# Patient Record
Sex: Male | Born: 1975 | Race: White | Hispanic: No | Marital: Married | Smoking: Current every day smoker
Health system: Southern US, Community
[De-identification: ages and names within clinical notes are randomized; demographics above are authoritative.]

## PROBLEM LIST (undated history)

## (undated) DIAGNOSIS — J45909 Unspecified asthma, uncomplicated: Secondary | ICD-10-CM

---

## 1997-12-12 ENCOUNTER — Emergency Department (HOSPITAL_COMMUNITY): Admission: EM | Admit: 1997-12-12 | Discharge: 1997-12-12 | Payer: Self-pay | Admitting: Emergency Medicine

## 1998-05-18 ENCOUNTER — Emergency Department (HOSPITAL_COMMUNITY): Admission: EM | Admit: 1998-05-18 | Discharge: 1998-05-18 | Payer: Self-pay | Admitting: Emergency Medicine

## 1998-05-18 ENCOUNTER — Encounter: Payer: Self-pay | Admitting: Emergency Medicine

## 1999-11-12 ENCOUNTER — Emergency Department (HOSPITAL_COMMUNITY): Admission: EM | Admit: 1999-11-12 | Discharge: 1999-11-12 | Payer: Self-pay

## 2000-04-28 ENCOUNTER — Encounter: Payer: Self-pay | Admitting: Emergency Medicine

## 2000-04-28 ENCOUNTER — Emergency Department (HOSPITAL_COMMUNITY): Admission: EM | Admit: 2000-04-28 | Discharge: 2000-04-28 | Payer: Self-pay | Admitting: Emergency Medicine

## 2002-09-25 ENCOUNTER — Encounter: Admission: RE | Admit: 2002-09-25 | Discharge: 2002-09-25 | Payer: Self-pay | Admitting: Family Medicine

## 2003-04-03 ENCOUNTER — Emergency Department (HOSPITAL_COMMUNITY): Admission: EM | Admit: 2003-04-03 | Discharge: 2003-04-03 | Payer: Self-pay | Admitting: *Deleted

## 2006-09-19 ENCOUNTER — Emergency Department (HOSPITAL_COMMUNITY): Admission: EM | Admit: 2006-09-19 | Discharge: 2006-09-19 | Payer: Self-pay | Admitting: Emergency Medicine

## 2006-11-17 ENCOUNTER — Emergency Department (HOSPITAL_COMMUNITY): Admission: EM | Admit: 2006-11-17 | Discharge: 2006-11-17 | Payer: Self-pay | Admitting: Emergency Medicine

## 2007-04-08 ENCOUNTER — Inpatient Hospital Stay (HOSPITAL_COMMUNITY): Admission: EM | Admit: 2007-04-08 | Discharge: 2007-04-12 | Payer: Self-pay | Admitting: Emergency Medicine

## 2007-04-12 ENCOUNTER — Ambulatory Visit: Payer: Self-pay | Admitting: *Deleted

## 2007-04-12 ENCOUNTER — Inpatient Hospital Stay (HOSPITAL_COMMUNITY): Admission: RE | Admit: 2007-04-12 | Discharge: 2007-04-15 | Payer: Self-pay | Admitting: *Deleted

## 2010-11-11 NOTE — Group Therapy Note (Signed)
[name], [name]              ACCOUNT NO.:  [id number]   MEDICAL RECORD NO.:  [id number]          PATIENT TYPE:  INP   LOCATION:  A225                          FACILITY:  [facility]   PHYSICIAN:  Osvaldo Shipper, MD     DATE OF BIRTH:  Dec 08, 1975   DATE OF PROCEDURE:  04/12/2007  DATE OF DISCHARGE:  04/12/2007                                 PROGRESS NOTE   SUBJECTIVE:  Continues to feel well.  No complaints offered.   Vital signs are all pretty stable.  Examination is unremarkable.   ASSESSMENT/PLAN:  The patient is still waiting for a bed at a  psychiatric facility to be evaluated by a psychiatrist for suicidal  attempt.  The circumstances of the attempts were after an argument with  his wife.  The patient appears to be more calm today.  However, I am not  sure if we do let him go home if the situation may recur without any  kind of psychiatric assessment and treatment.  So I think the patient  still needs to be seen by a psychiatrist before he can be discharged  home.  He has been medically cleared for the past 4 days.  Unfortunately, there is no bed available at [facility]. Hence, he has  been waiting here. However, I was told that there is the possibility  that a bed might open up tomorrow and so the patient will be able to go  there tomorrow.      Osvaldo Shipper, MD  Electronically Signed     GK/MEDQ  D:  04/12/2007  T:  04/12/2007  Job:  [id number]

## 2010-11-11 NOTE — H&P (Signed)
[name], HUGE              ACCOUNT NO.:  [id number]   MEDICAL RECORD NO.:  [id number]          PATIENT TYPE:  INP   LOCATION:  IC06                          FACILITY:  [facility]   PHYSICIAN:  Marcello Moores, MD   DATE OF BIRTH:  05-Oct-1975   DATE OF ADMISSION:  04/07/2007  DATE OF DISCHARGE:  LH                              HISTORY & PHYSICAL   PRIMARY MEDICAL DOCTOR:  Unassigned.   CHIEF COMPLAINT:  He was brought by EMS after he took clorazepate and  Tylenol for suicidal purpose.   HISTORY OF PRESENT ILLNESS:  [name] is a 35 year old man, married,  who was brought by EMS to the emergency room today after he took the  above medications for drug overdose.  The history was obtained from EMS  and family by the emergency room physician and the patient and taken  Tylenol and benzodiazepine from his father's medication for a suicidal  attempt.  Currently, the patient is very drowsy and he cannot give any  reasonable history, but he they stated that he took the medication  because he had some argument with his wife and he felt that she no  longer loves him, but later on, the patient also denied that he took it  to kill himself, only stated that he took it because he had a very bad  day.  Currently, the patient is sleepy; otherwise, he is with stable  vital signs and the patient received charcoal in the emergency room and  Poison Control was contacted by the emergency room physician and  recommended to monitor him only.  I tried to take some history from him,  but the patient is very drowsy and sleepy.  He cannot give currently any  history.   REVIEW OF SYSTEMS:  As mentioned in the HPI; it is limited.   ALLERGIES:  NO KNOWN DRUG ALLERGIES.   SOCIAL HISTORY:  He is married and has children, did not smoke tobacco,  drug or alcohol abuse.   PAST MEDICAL HISTORY:  None.   MEDICATIONS:  None.   PHYSICAL EXAMINATION:  GENERAL:  The patient is lying in the emergency  room bed  attached to the monitor, drowsy.  VITAL SIGNS: Temperature 97, blood pressure is 125/73, pulse is 93 and  respiratory rate 20, and saturation is 98%.  HEENT:  He has pink conjunctivae and pupils are equal and reactive, but  slowly.  NECK:  Supple.  CHEST:  Good air entry.  CV: S1-S2 well-heard, regular.  ABDOMEN:  Soft.  EXTREMITIES:  No edema.  CNS:  He is drowsy and sleepy, but there are not any focal neurological  deficits.   LABORATORY DATA:  White blood cell count is 9.6, hemoglobin is 15 and  hematocrit is 43 and platelet count is 179,000.  On the chemistries,  sodium is 139, potassium 3.7, chloride is 107 and bicarb 27, glucose 91,  BUN 15, creatinine is 1.  Alcohol level is less than 5 and acetaminophen  level is 32, on the upper normal.  Urine drug screen is pending.   ASSESSMENT:  1. Drug overdose  with acetaminophen, possibility also Benadryl and      benzodiazepines and the patient is drowsy; otherwise, he is stable      currently and will admit him to intensive care unit.  We will put      him on a monitor to monitor his vital signs, especially his      respiratory rate and blood pressure.  We will repeat the      acetaminophen level every 4 hours and will send also liver function      tests and we will repeat it also after 4 hours and we will follow      the urine screen and we will manage him according to his clinical      response.  2. Probably suicidal attempt with this drug overdose.  We will call      ACT team to evaluate him for his psychiatric issues and we will      rehydrate him with intravenous fluid and will put him on Protonix      and Lovenox prophylaxis.      Marcello Moores, MD  Electronically Signed     MT/MEDQ  D:  04/08/2007  T:  04/08/2007  Job:  [id number]

## 2010-11-11 NOTE — Discharge Summary (Signed)
[name], [name]              ACCOUNT NO.:  [id number]   MEDICAL RECORD NO.:  [id number]          PATIENT TYPE:  INP   LOCATION:  A225                          FACILITY:  [facility]   PHYSICIAN:  Osvaldo Shipper, MD     DATE OF BIRTH:  28-Oct-1975   DATE OF ADMISSION:  04/07/2007  DATE OF DISCHARGE:  10/11/2008LH                               DISCHARGE SUMMARY   The patient is being transferred to [facility] for psychiatric are.   ADMISSION DIAGNOSES:  1. Suicidal attempt with Tranxene and Tylenol.  2. Likely depression.   Please refer H&P dictated by Dr. Benson Setting for details regarding patient's  presenting illness.   BRIEF HOSPITAL COURSE:  Briefly, this is a 35 year old Caucasian male  who was brought in by EMS after he overdosed on Tranxene and Tylenol. He  apparently took these medications after an argument with his wife. There  is no previous history of suicidal attempt as far as we know. The  patient was drowsy when he was brought in. He was monitored in the ICU  when he woke up. He was subsequently transferred out to the floor. His  Tylenol level initially was 31, and at 4 hours, it was normal. Tylenol  levels were followed over the next day, and they continued to be less  than 10. He had a mild elevation in his bilirubin which prompted concern  from the psychiatric facility, and today, the LFTs are completely  normal. His EKG showed some early repolarization changes in I and aVL  which have remained stable. He never complained of any chest pain.   Over the past two to three days, the patient has remained stable. His  vital signs were stable. Considering the above, he continues to be  stable and is okay for transfer to a psychiatric facility. He will need  to be evaluated by a psychiatrist who can then determine his  disposition.   He needs to be on a nicotine patch 21 mg every day as he smokes a lot of  cigarettes. He smokes about 2 packs of cigarettes on a daily  basis.   Further disposition according to the psychiatrist. This morning, he was  seen, no complains were offered. His vital signs were stable.  Examination was unremarkable. Labs show that his bilirubin is now  completely normal.      Osvaldo Shipper, MD  Electronically Signed     GK/MEDQ  D:  04/09/2007  T:  04/09/2007  Job:  [id number]

## 2010-11-11 NOTE — Group Therapy Note (Signed)
NAMEPETROS, [name]              ACCOUNT NO.:  [id number]   MEDICAL RECORD NO.:  [id number]          PATIENT TYPE:  INP   LOCATION:  A225                          FACILITY:  [facility]   PHYSICIAN:  Osvaldo Shipper, MD     DATE OF BIRTH:  1975-07-08   DATE OF PROCEDURE:  04/11/2007  DATE OF DISCHARGE:                                 PROGRESS NOTE   Really no other issues on this patient.  Everything is stable.  His  wheezing has resolved.  His vital signs are all stable.  He has no  complaints to offer.  Unfortunately we are still waiting on a  disposition on him.  He needs to be seen by a psychiatrist before he can  be cleared for discharge.  Medically he has been stable for the past 3  days.  Hopefully we will have some resolution to this issue in the next  day or two.      Osvaldo Shipper, MD  Electronically Signed     GK/MEDQ  D:  04/11/2007  T:  04/11/2007  Job:  [id number]

## 2010-11-11 NOTE — Discharge Summary (Signed)
NAMEJOSEDANIEL, HAYE NO.:  [id number]   MEDICAL RECORD NO.:  [id number]          PATIENT TYPE:  IPS   LOCATION:  0605                          [facility]   PHYSICIAN:  Jasmine Pang, M.D. DATE OF BIRTH:  07-Jun-1976   DATE OF ADMISSION:  04/12/2007  DATE OF DISCHARGE:  04/15/2007                               DISCHARGE SUMMARY   IDENTIFICATION:  This is a 35 year old married white male who was  admitted on an involuntary basis on April 12, 2007.   HISTORY OF PRESENT ILLNESS:  The patient presents with a history of  intentional overdose of Tylenol and Tranxene.  He is here on papers.  Papers state that the patient overdosed on 10 Tylenol PM and 25 Tranxene  tablets.  The patient states that he had spent 4 days in the emergency  room at [facility] until a bed was found for him.  He states that he had  been taking his father's Tranxene.  He reports his intention was to go  to sleep.  He states it was not a suicidal gesture.  He called a friend  because he was not feeling well.  The friend called 911 and the  ambulance brought him to the emergency department.  The patient states  he has been depressed for at least 5 years.  He has never been on any  antidepressant.  He is having money issues.  His wife, although  supportive, is concerned about his being unemployed again.  They are  having economic problems at home.  This is the first admission to  [facility].  There is no other suicidal gestures or  attempts.  No other psychiatric hospitalizations.  He does not see a  therapist or psychiatrist as an outpatient.  He reports his father is on  medication for anxiety.  No other known family history.  He denies any  alcohol or drug use.  There are no known medical problems.  No  medications.  He is allergic to SULFA (history of hives).   PHYSICAL EXAMINATION:  The patient was fully assessed at [facility].  There were no acute physical or  medical findings.   LABORATORY DATA:  The patient's CBC was within normal limits.  Liver  enzymes were within normal limits.  Acetaminophen level was initially  high at 31.8, down to less than 10 prior to transfer.  Urine drug screen  was positive for benzodiazepines.   HOSPITAL COURSE:  Upon admission, the patient was started on Ambien 10  mg p.o. q.h.s. p.r.n. insomnia and Librium 10 mg p.o. q.6h. p.r.n.  withdrawal/anxiety.  On April 13, 2007, he was also started on Celexa  20 mg p.o. q.h.s. and Abilify 10 mg p.o. q.h.s.  The patient tolerated  these medications well.  He was friendly and cooperative.  He was able  to participate appropriately in the therapeutic groups and activities.  He discussed his overdose.  He reports he had been in an argument with  his wife.  He stated, however, in general, he gets along with his wife.  As  hospitalization progressed, he became less depressed and anxious.  No  suicidal or homicidal ideation.  He began to look forward to going home.  He felt he could manage stressors better.  On April 15, 2007, mental  status had improved markedly from admission status.  The patient was  friendly and cooperative with good eye contact.  Speech was normal rate  and flow.  Psychomotor activity was within normal limits.  Mood  euthymic.  Affect wide range.  No suicidal or homicidal ideation.  No  thoughts of self-injurious behavior.  No auditory or visual  hallucinations.  No paranoia or delusions.  Thoughts were logical and  goal directed.  Thought content, no predominant theme.  Cognitive was  grossly back to baseline and it was felt the patient was safe to return  home today.  His followup was scheduled in [place] at the mental  health center and [facility].  We had attempted to schedule a  family session with him and his wife, but there was no availability  before he left.   DISCHARGE DIAGNOSES:  AXIS I:  Mood disorder, not otherwise specified.   AXIS II:  None.  AXIS III:  None.  AXIS IV:  Severe (occupational problems, economic issues, burden of  psychiatric illness, problems with some conflict with wife).  AXIS V:  Global assessment of functioning was 50 upon discharge.  Global  assessment of functioning was 35 upon admission.  Global assessment of  functioning was 65 highest past year.   DISCHARGE/PLAN:  There were no specific activity level or dietary  restrictions.   POST-HOSPITAL CARE PLANS:  The patient will be seen at the [facility] on April 27, 2007, at 3:30 p.m.  He will  be seen in [facility] in [place] on April 20, 2007, at 10 o'clock a.m.   DISCHARGE MEDICATIONS:  1. Celexa 20 mg at bedtime.  2. Abilify 10 mg at bedtime.  3. Ambien 10 mg at bedtime p.r.n. insomnia.      Jasmine Pang, M.D.  Electronically Signed     BHS/MEDQ  D:  04/15/2007  T:  04/15/2007  Job:  [id number]

## 2010-11-11 NOTE — Discharge Summary (Signed)
NAMEBERKLEY, CRONKRIGHT              ACCOUNT NO.:  [id number]   MEDICAL RECORD NO.:  [id number]          PATIENT TYPE:  INP   LOCATION:  A225                          FACILITY:  [facility]   PHYSICIAN:  Osvaldo Shipper, MD     DATE OF BIRTH:  [birth year]   DATE OF ADMISSION:  04/12/2007  DATE OF DISCHARGE:  10/14/2008LH                               DISCHARGE SUMMARY   DISCHARGE SUMMARY   Please review H&P dictated by Dr. Marcello Moores and my progress notes  and discharge summary dictated previously.   BRIEF HOSPITAL COURSE:  Briefly, this is a 35 year old man who presented  after he was brought in by the EMS for being less responsive.  He  apparently took some Tylenol and benzodiazepine in the from of Tranxene  at home after an argument with his wife.  The patient mentioned that he  has been depressed.  He also gets upset after arguments with his wife.  The patient was monitored in the ICU overnight.  He did well.  He was  transferred to the floor and was medically cleared for discharge.  For  the past 4 days, he has been waiting to be placed into a behavioral  health facility.  I was just informed a few minutes ago that the bed has  opened up, and he will be transferred later today to behavioral health.  As mentioned above, he has continued to remain stable for the past 4  days.  He did have a slightly elevated bilirubin, which also normalized.  He also had some early depolarization changes on his EKG.   He is not being sent on any medications at this time.  He might require  to be on some psychiatric medications as per the psychiatrist at  behavioral health.   Followup and further disposition to be addressed on discharge from  behavioral health.      Osvaldo Shipper, MD  Electronically Signed     GK/MEDQ  D:  04/12/2007  T:  04/13/2007  Job:  [id number]

## 2010-11-11 NOTE — Group Therapy Note (Signed)
[name], [name]              ACCOUNT NO.:  [id number]   MEDICAL RECORD NO.:  [id number]          PATIENT TYPE:  INP   LOCATION:  A225                          FACILITY:  [facility]   PHYSICIAN:  Osvaldo Shipper, MD     DATE OF BIRTH:  September 02, 1975   DATE OF PROCEDURE:  04/10/2007  DATE OF DISCHARGE:                                 PROGRESS NOTE   Please see the discharge summary dictated yesterday for details  regarding the patient's hospital stay.   Subjectively, he continues to do well. There are no symptoms. His vital  signs are all stable. Examination is unremarkable.   Again, he is waiting on a bed at [facility] for psychiatric  evaluation. Unfortunately, it is taking longer than we expected. We will  try to contact them again today to see what is the likelihood of him  getting a bed today. The patient understands that he has to wait and  there are no new issues at this. His blood work from yesterday showed  that his LFTs are completely normal, but his EKG continues to show some  early repolarization changes. He does not have any chest pain or any  other symptoms at this time.   So in summary, the patient continues to be medically stable and is  awaiting a bed for psychiatric evaluation.      Osvaldo Shipper, MD  Electronically Signed     GK/MEDQ  D:  04/10/2007  T:  04/10/2007  Job:  [id number]

## 2010-11-11 NOTE — H&P (Signed)
NAMEELON, EOFF NO.:  [id number]   MEDICAL RECORD NO.:  [id number]          PATIENT TYPE:  IPS   LOCATION:  0605                          [facility]ACILITY:  [facility]   PHYSICIAN:  Jasmine Pang, M.D. DATE OF BIRTH:  June 23, 1976   DATE OF ADMISSION:  04/12/2007  DATE OF DISCHARGE:                       PSYCHIATRIC ADMISSION ASSESSMENT   IDENTIFYING INFORMATION:  This is a 35 year old married white male who  is involuntary committed April 12, 2007.   HISTORY OF PRESENT ILLNESS:  The patient presents with a history of an  intentional overdose of Tylenol and Tranxene.  He is here on papers.  Papers state that the patient overdosed on 10 Tylenol PM and 25 Tranxene  tablets.  The patient states that he had spent four days in emergency  room at [facility] until a bed was found for him.  He states he has been  taking his father's Tranxene.  He states his intention was to go to  sleep.  States it was not a suicidal gesture.  He called his friend  because he was not feeling well.  Friend had called 9-1-1 and an  ambulance was brought to his home.  The patient states he has been  depressed for least five years.  Has never been on any antidepressant.  He is having money issues.  Wife although supportive is concerned about  his being unemployed and again, economic problems at home.   PAST PSYCHIATRIC HISTORY:  First admission to the [facility].  No other suicide gestures or attempts.  No other psychiatric  hospitalizations, does not see a therapist or a psychiatrist as an  outpatient.   SOCIAL HISTORY:  This is a 35 year old married white male, married for  10 years, has three children.  He is unemployed.  The patient was to  start work this past Monday.   FAMILY HISTORY:  Father is on medication for anxiety.   ALCOHOL/DRUG HISTORY:  She denies any alcohol or drug use.   PRIMARY CARE PHYSICIAN:  Has none.   MEDICAL PROBLEMS:  No known medical problems.   MEDICATIONS:  None.   ALLERGIES:  SULFA.  Reports a history of hives.   PHYSICAL EXAMINATION:  The patient was fully assessed at [facility].  He is a tall well-nourished male.  He was charcoaled after  his overdose.  His temperature is 97.3, heart rate 77, respirations 12,  blood pressure 151/98, height 6 feet 4 inches tall, weight 239 pounds.   LABORATORY DATA:  His CBC is within normal limits.  Liver enzymes were  within normal limits.  Acetaminophen level was initially high at 31.8,  down to less than 10 prior to transfer.  Urine drug screen was positive  for benzodiazepines.   MENTAL STATUS EXAM:  He is fully alert, cooperative, casually dressed,  good eye contact.  Speech is rapid, sometimes difficult to understand.  Mood is anxious and depressed.  The patient appears sad.  Thought  processes are coherent.  No evidence of any thought disorder.  Cognitive  function intact.  Memory is good.  Judgment and insight  are fair.  He  appears sincere.   DIAGNOSES:  AXIS I:  Major depressive disorder.  AXIS II:  Deferred.  AXIS III:  No known medical problems.  AXIS IV:  Problems with occupation, economic issues.  AXIS V:  Current 35.   PLAN:  Contract for safety.  Stabilize mood and thinking.  Will consider  an antidepressant with risks and benefits being discussed.  The patient  is agreeable to beginning medications.  Will have a family session with  his wife.  [facility] is to obtain follow-up.   TENTATIVE LENGTH OF STAY:  Three to four days.      Landry Corporal, N.P.      Jasmine Pang, M.D.  Electronically Signed    JO/MEDQ  D:  04/13/2007  T:  04/13/2007  Job:  [id number]

## 2010-12-07 ENCOUNTER — Emergency Department (HOSPITAL_COMMUNITY): Payer: Self-pay

## 2010-12-07 ENCOUNTER — Emergency Department (HOSPITAL_COMMUNITY)
Admission: EM | Admit: 2010-12-07 | Discharge: 2010-12-08 | Disposition: A | Discharge status: Home / Self Care | Payer: Self-pay | Attending: Emergency Medicine | Admitting: Emergency Medicine

## 2010-12-07 DIAGNOSIS — Y92410 Unspecified street and highway as the place of occurrence of the external cause: Secondary | ICD-10-CM | POA: Insufficient documentation

## 2010-12-07 DIAGNOSIS — R05 Cough: Secondary | ICD-10-CM | POA: Insufficient documentation

## 2010-12-07 DIAGNOSIS — S20219A Contusion of unspecified front wall of thorax, initial encounter: Secondary | ICD-10-CM | POA: Insufficient documentation

## 2010-12-07 DIAGNOSIS — R059 Cough, unspecified: Secondary | ICD-10-CM | POA: Insufficient documentation

## 2010-12-07 DIAGNOSIS — R079 Chest pain, unspecified: Secondary | ICD-10-CM | POA: Insufficient documentation

## 2010-12-09 ENCOUNTER — Emergency Department (HOSPITAL_COMMUNITY)
Admission: EM | Admit: 2010-12-09 | Discharge: 2010-12-09 | Disposition: A | Discharge status: Home / Self Care | Payer: Self-pay | Attending: Emergency Medicine | Admitting: Emergency Medicine

## 2010-12-09 DIAGNOSIS — F1020 Alcohol dependence, uncomplicated: Secondary | ICD-10-CM | POA: Insufficient documentation

## 2010-12-09 DIAGNOSIS — F1010 Alcohol abuse, uncomplicated: Secondary | ICD-10-CM | POA: Insufficient documentation

## 2010-12-09 DIAGNOSIS — F1410 Cocaine abuse, uncomplicated: Secondary | ICD-10-CM | POA: Insufficient documentation

## 2010-12-09 DIAGNOSIS — F411 Generalized anxiety disorder: Secondary | ICD-10-CM | POA: Insufficient documentation

## 2010-12-09 LAB — CBC
MCH: 32.5 pg (ref 26.0–34.0)
MCV: 91.6 fL (ref 78.0–100.0)
Platelets: 195 10*3/uL (ref 150–400)
RBC: 5.02 MIL/uL (ref 4.22–5.81)
RDW: 12.7 % (ref 11.5–15.5)

## 2010-12-09 LAB — COMPREHENSIVE METABOLIC PANEL
ALT: 36 U/L (ref 0–53)
Alkaline Phosphatase: 94 U/L (ref 39–117)
BUN: 14 mg/dL (ref 6–23)
CO2: 26 mEq/L (ref 19–32)
Chloride: 101 mEq/L (ref 96–112)
GFR calc Af Amer: >60 mL/min (ref >60)
GFR calc non Af Amer: >60 mL/min (ref >60)
Glucose, Bld: 137 mg/dL — ABNORMAL HIGH (ref 70–99)
Potassium: 3.8 mEq/L (ref 3.5–5.1)
Sodium: 138 mEq/L (ref 135–145)
Total Bilirubin: 1.4 mg/dL — ABNORMAL HIGH (ref 0.3–1.2)
Total Protein: 7.7 g/dL (ref 6.0–8.3)

## 2010-12-09 LAB — DIFFERENTIAL
Basophils Relative: 0 % (ref 0–1)
Eosinophils Absolute: 0.2 10*3/uL (ref 0.0–0.7)
Eosinophils Relative: 2 % (ref 0–5)
Monocytes Relative: 7 % (ref 3–12)
Neutrophils Relative %: 61 % (ref 43–77)

## 2010-12-09 LAB — RAPID URINE DRUG SCREEN, HOSP PERFORMED
Amphetamines: NOT DETECTED
Opiates: NOT DETECTED

## 2011-04-09 LAB — HEPATIC FUNCTION PANEL
ALT: 41
AST: 23
AST: 23
Albumin: 3.7
Albumin: 3.7
Albumin: 4
Albumin: 4.1
Alkaline Phosphatase: 85
Indirect Bilirubin: 1.1 — ABNORMAL HIGH
Indirect Bilirubin: 1.1 — ABNORMAL HIGH
Total Bilirubin: 1
Total Bilirubin: 1.3 — ABNORMAL HIGH
Total Protein: 6
Total Protein: 6.2
Total Protein: 6.8

## 2011-04-09 LAB — ACETAMINOPHEN LEVEL
Acetaminophen (Tylenol), Serum: <10 — ABNORMAL LOW
Acetaminophen (Tylenol), Serum: <10 — ABNORMAL LOW

## 2011-04-09 LAB — BASIC METABOLIC PANEL
Calcium: 8.5
Chloride: 107
Chloride: 108
Creatinine, Ser: 0.92
GFR calc Af Amer: >60
GFR calc Af Amer: >60
GFR calc non Af Amer: >60
Potassium: 3.7

## 2011-04-09 LAB — DIFFERENTIAL
Eosinophils Absolute: 0.3
Eosinophils Relative: 3
Lymphs Abs: 4.1 — ABNORMAL HIGH
Monocytes Relative: 7
Neutrophils Relative %: 47

## 2011-04-09 LAB — RAPID URINE DRUG SCREEN, HOSP PERFORMED
Amphetamines: NOT DETECTED
Benzodiazepines: POSITIVE — AB
Opiates: NOT DETECTED
Tetrahydrocannabinol: NOT DETECTED

## 2011-04-09 LAB — CBC
HCT: 42.9
MCV: 93.8
RBC: 4.58
WBC: 9.6

## 2011-04-09 LAB — ETHANOL: Alcohol, Ethyl (B): <5

## 2011-04-18 ENCOUNTER — Emergency Department (HOSPITAL_COMMUNITY)
Admission: EM | Admit: 2011-04-18 | Discharge: 2011-04-18 | Disposition: A | Discharge status: Home / Self Care | Payer: Self-pay | Attending: Emergency Medicine | Admitting: Emergency Medicine

## 2011-04-18 DIAGNOSIS — J029 Acute pharyngitis, unspecified: Secondary | ICD-10-CM | POA: Insufficient documentation

## 2011-04-18 DIAGNOSIS — R5381 Other malaise: Secondary | ICD-10-CM | POA: Insufficient documentation

## 2011-04-18 DIAGNOSIS — J3489 Other specified disorders of nose and nasal sinuses: Secondary | ICD-10-CM | POA: Insufficient documentation

## 2011-04-18 DIAGNOSIS — R220 Localized swelling, mass and lump, head: Secondary | ICD-10-CM | POA: Insufficient documentation

## 2011-04-18 DIAGNOSIS — R509 Fever, unspecified: Secondary | ICD-10-CM | POA: Insufficient documentation

## 2011-04-18 DIAGNOSIS — R5383 Other fatigue: Secondary | ICD-10-CM | POA: Insufficient documentation

## 2011-04-18 DIAGNOSIS — IMO0001 Reserved for inherently not codable concepts without codable children: Secondary | ICD-10-CM | POA: Insufficient documentation

## 2011-04-18 DIAGNOSIS — R221 Localized swelling, mass and lump, neck: Secondary | ICD-10-CM | POA: Insufficient documentation

## 2011-07-01 ENCOUNTER — Encounter: Payer: Self-pay | Admitting: *Deleted

## 2011-07-01 ENCOUNTER — Emergency Department (HOSPITAL_COMMUNITY)
Admission: EM | Admit: 2011-07-01 | Discharge: 2011-07-01 | Disposition: A | Discharge status: Home / Self Care | Payer: Self-pay | Attending: Emergency Medicine | Admitting: Emergency Medicine

## 2011-07-01 DIAGNOSIS — M542 Cervicalgia: Secondary | ICD-10-CM | POA: Insufficient documentation

## 2011-07-01 DIAGNOSIS — M25519 Pain in unspecified shoulder: Secondary | ICD-10-CM | POA: Insufficient documentation

## 2011-07-01 DIAGNOSIS — H9209 Otalgia, unspecified ear: Secondary | ICD-10-CM | POA: Insufficient documentation

## 2011-07-01 DIAGNOSIS — M62838 Other muscle spasm: Secondary | ICD-10-CM | POA: Insufficient documentation

## 2011-07-01 DIAGNOSIS — IMO0001 Reserved for inherently not codable concepts without codable children: Secondary | ICD-10-CM | POA: Insufficient documentation

## 2011-07-01 DIAGNOSIS — H9210 Otorrhea, unspecified ear: Secondary | ICD-10-CM | POA: Insufficient documentation

## 2011-07-01 MED ORDER — IBUPROFEN 800 MG PO TABS: 800.0000 mg | ORAL_TABLET | Freq: Three times a day (TID) | ORAL | Status: AC

## 2011-07-01 MED ORDER — OXYCODONE-ACETAMINOPHEN 5-325 MG PO TABS
1.0000 | ORAL_TABLET | Freq: Once | ORAL | Status: AC
  Administered 2011-07-01: 1 via ORAL
  Filled 2011-07-01: qty 1

## 2011-07-01 MED ORDER — CYCLOBENZAPRINE HCL 10 MG PO TABS: 10.0000 mg | ORAL_TABLET | Freq: Three times a day (TID) | ORAL | Status: AC | PRN

## 2011-07-01 MED ORDER — CYCLOBENZAPRINE HCL 10 MG PO TABS
10.0000 mg | ORAL_TABLET | Freq: Once | ORAL | Status: AC
  Administered 2011-07-01: 10 mg via ORAL
  Filled 2011-07-01: qty 1

## 2011-07-01 MED ORDER — IBUPROFEN 800 MG PO TABS
800.0000 mg | ORAL_TABLET | Freq: Once | ORAL | Status: AC
  Administered 2011-07-01: 800 mg via ORAL
  Filled 2011-07-01: qty 1

## 2011-07-01 NOTE — ED Notes (Signed)
Sharp pain from rt ear down thru neck since last sat denies injury states his dada did give him some valuim but he still hurtas

## 2011-07-01 NOTE — ED Notes (Signed)
Patient woke with a sharp pain from the back on his right ear that moves down his neck and into his shoulder.  Patient denies any trauma

## 2011-07-02 NOTE — ED Provider Notes (Signed)
History     CSN: [id number]  Arrival date & time 07/01/11  1003   First MD Initiated Contact with Patient 07/01/11 1115      Chief Complaint  Patient presents with  . Otalgia  . Neck Pain  . Shoulder Pain    (Consider location/radiation/quality/duration/timing/severity/associated sxs/prior treatment) Patient is a 36 y.o. male presenting with ear pain, neck pain, and shoulder pain. The history is provided by the patient. No language interpreter was used.  Otalgia This is a new problem. The current episode started more than 2 days ago. There is pain in the right ear. The problem occurs constantly. The problem has been gradually worsening. There has been no fever. The pain is at a severity of 6/10. The pain is moderate. Associated symptoms include ear discharge and neck pain. Pertinent negatives include no headaches, no hearing loss and no diarrhea. His past medical history does not include chronic ear infection or hearing loss.  Neck Pain  Pertinent negatives include no headaches.  Shoulder Pain Associated symptoms include neck pain. Pertinent negatives include no headaches.    History reviewed. No pertinent past medical history.  History reviewed. No pertinent past surgical history.  No family history on file.  History  Substance Use Topics  . Smoking status: Current Everyday Smoker  . Smokeless tobacco: Not on file  . Alcohol Use: No      Review of Systems  HENT: Positive for ear pain, neck pain and ear discharge. Negative for hearing loss.   Gastrointestinal: Negative for diarrhea.  Neurological: Negative for headaches.  All other systems reviewed and are negative.    Allergies  Sulfa antibiotics  Home Medications   Current Outpatient Rx  Name Route Sig Dispense Refill  . CYCLOBENZAPRINE HCL 10 MG PO TABS Oral Take 1 tablet (10 mg total) by mouth 3 (three) times daily as needed for muscle spasms. 30 tablet 0  . IBUPROFEN 800 MG PO TABS Oral Take 1 tablet (800  mg total) by mouth 3 (three) times daily. 21 tablet 0    BP 134/75  Pulse 75  Temp(Src) 97.9 F (36.6 C) (Oral)  Resp 16  Ht 6' 2.5" (1.892 m)  Wt 230 lb (104.327 kg)  BMI 29.14 kg/m2  SpO2 96%  Physical Exam  Nursing note and vitals reviewed. Constitutional: He is oriented to person, place, and time. He appears well-developed and well-nourished. He appears distressed.  HENT:  Head: Normocephalic.  Eyes: Pupils are equal, round, and reactive to light.  Neck: No JVD present. No tracheal deviation present. No thyromegaly present.       Muscle spasm to R trapezious  Cardiovascular: Normal rate.   Pulmonary/Chest: Effort normal. No stridor.  Musculoskeletal: He exhibits tenderness.  Lymphadenopathy:    He has no cervical adenopathy.  Neurological: He is alert and oriented to person, place, and time.  Skin: Skin is warm and dry. No rash noted. No erythema.    ED Course  Procedures (including critical care time)  Labs Reviewed - No data to display No results found.   1. Muscle spasm       MDM  Muscle spasm to R neck extending from R ear to R shoulder. Will use ice/heat, ibuprofen and flexeril  For treatment. Denies fever and headache.  Slept on the couch 3 nights ago and woke with the pain.        Jethro Bastos, NP 07/02/11 [phone]

## 2011-07-03 NOTE — ED Provider Notes (Signed)
Medical screening examination/treatment/procedure(s) were performed by non-physician practitioner and as supervising physician I was immediately available for consultation/collaboration.  Donnetta Hutching, MD 07/03/11 1558

## 2012-05-12 ENCOUNTER — Encounter (HOSPITAL_COMMUNITY): Payer: Self-pay | Admitting: *Deleted

## 2012-05-12 ENCOUNTER — Emergency Department (INDEPENDENT_AMBULATORY_CARE_PROVIDER_SITE_OTHER)
Admission: EM | Admit: 2012-05-12 | Discharge: 2012-05-12 | Disposition: A | Discharge status: Home / Self Care | Payer: Self-pay | Source: Home / Self Care

## 2012-05-12 DIAGNOSIS — B86 Scabies: Secondary | ICD-10-CM

## 2012-05-12 HISTORY — DX: Unspecified asthma, uncomplicated: J45.909

## 2012-05-12 MED ORDER — PREDNISONE 20 MG PO TABS: ORAL_TABLET | ORAL | Status: DC

## 2012-05-12 MED ORDER — PERMETHRIN 5 % EX CREA: TOPICAL_CREAM | CUTANEOUS | Status: DC

## 2012-05-12 NOTE — ED Provider Notes (Signed)
Medical screening examination/treatment/procedure(s) were performed by resident physician or non-physician practitioner and as supervising physician I was immediately available for consultation/collaboration.   Blayne Frankie DOUGLAS MD.    Angad Nabers D Theseus Birnie, MD 05/12/12 2123 

## 2012-05-12 NOTE — ED Provider Notes (Signed)
History     CSN: [id number]  Arrival date & time 05/12/12  1746   None     Chief Complaint  Patient presents with  . Rash    (Consider location/radiation/quality/duration/timing/severity/associated sxs/prior treatment) HPI Comments: 36 year old male complaining of a rash for 2 weeks. The rash fracture the upper lower extremities and torso. He states his wife has some of the same type rash symptoms. Is very pruritic and there scratch marks all over his body. He denies any known exposure to chemicals or other agents that may have caused it. He denies problems with swelling, or problems breathing.   Past Medical History  Diagnosis Date  . Asthma in pediatric patient     Asthma as a child    History reviewed. No pertinent past surgical history.  History reviewed. No pertinent family history.  History  Substance Use Topics  . Smoking status: Current Every Day Smoker -- 1.0 packs/day    Types: Cigarettes  . Smokeless tobacco: Not on file  . Alcohol Use: No      Review of Systems  Constitutional: Negative.  Negative for activity change and fatigue.  HENT: Negative.   Respiratory: Negative.   Cardiovascular: Negative.   Gastrointestinal: Negative.   Musculoskeletal: Negative.   All other systems reviewed and are negative.    Allergies  Sulfa antibiotics  Home Medications   Current Outpatient Rx  Name  Route  Sig  Dispense  Refill  . PERMETHRIN 5 % EX CREA      Apply from chin down, leave on for 8-14 hours, rinse. Repeat in 1 week   60 g   1   . PREDNISONE 20 MG PO TABS      Take 3 tabs po on first day, 2 tabs second day, 2 tabs third day, 1 tab fourth day, 1 tab 5th day. Take with food.   9 tablet   0     BP 148/80  Pulse 73  Temp 97.8 F (36.6 C) (Oral)  Resp 16  SpO2 99%  Physical Exam  Constitutional: He is oriented to person, place, and time. He appears well-developed and well-nourished. No distress.  HENT:  Head: Normocephalic and  atraumatic.  Eyes: Conjunctivae normal and EOM are normal.  Neck: Normal range of motion. Neck supple.  Pulmonary/Chest: Effort normal.  Musculoskeletal: Normal range of motion. He exhibits no edema.  Neurological: He is alert and oriented to person, place, and time.  Skin:       Rough, papular, pruritic rash distributed to upper lower extremities and torso. Color varies from flesh-colored to red.  Psychiatric: He has a normal mood and affect.    ED Course  Procedures (including critical care time)  Labs Reviewed - No data to display No results found.   1. Scabies       MDM  Elimite applied from chin to toes been resolved 8 hours. Repeat in one week Instructions on reading close cropped furniture of scabies. Your male significant other also needs to be treated.        Hayden Rasmussen, NP 05/12/12 2000

## 2012-05-12 NOTE — ED Notes (Signed)
His Dad let someone stay over in his bed about 4-5 weeks.  He move his bed another house a few days later.  Rash onset 1 week with itching.  Started on his inner thighs, back and sides of abdomen and has spread all over his body.

## 2013-07-17 ENCOUNTER — Emergency Department (HOSPITAL_COMMUNITY): Payer: Self-pay

## 2013-07-17 ENCOUNTER — Encounter (HOSPITAL_COMMUNITY): Payer: Self-pay | Admitting: Emergency Medicine

## 2013-07-17 ENCOUNTER — Emergency Department (HOSPITAL_COMMUNITY)
Admission: EM | Admit: 2013-07-17 | Discharge: 2013-07-17 | Disposition: A | Discharge status: Home / Self Care | Payer: Self-pay | Attending: Emergency Medicine | Admitting: Emergency Medicine

## 2013-07-17 DIAGNOSIS — IMO0002 Reserved for concepts with insufficient information to code with codable children: Secondary | ICD-10-CM | POA: Insufficient documentation

## 2013-07-17 DIAGNOSIS — R0602 Shortness of breath: Secondary | ICD-10-CM | POA: Insufficient documentation

## 2013-07-17 DIAGNOSIS — Z8709 Personal history of other diseases of the respiratory system: Secondary | ICD-10-CM | POA: Insufficient documentation

## 2013-07-17 DIAGNOSIS — J209 Acute bronchitis, unspecified: Secondary | ICD-10-CM | POA: Insufficient documentation

## 2013-07-17 DIAGNOSIS — R062 Wheezing: Secondary | ICD-10-CM | POA: Insufficient documentation

## 2013-07-17 DIAGNOSIS — Z882 Allergy status to sulfonamides status: Secondary | ICD-10-CM | POA: Insufficient documentation

## 2013-07-17 DIAGNOSIS — F17200 Nicotine dependence, unspecified, uncomplicated: Secondary | ICD-10-CM | POA: Insufficient documentation

## 2013-07-17 DIAGNOSIS — M549 Dorsalgia, unspecified: Secondary | ICD-10-CM | POA: Insufficient documentation

## 2013-07-17 DIAGNOSIS — J45909 Unspecified asthma, uncomplicated: Secondary | ICD-10-CM

## 2013-07-17 LAB — BASIC METABOLIC PANEL
BUN: 14 mg/dL (ref 6–23)
CALCIUM: 9.1 mg/dL (ref 8.4–10.5)
CO2: 26 mEq/L (ref 19–32)
Chloride: 102 mEq/L (ref 96–112)
Creatinine, Ser: 0.78 mg/dL (ref 0.50–1.35)
Glucose, Bld: 91 mg/dL (ref 70–99)
Potassium: 4.2 mEq/L (ref 3.7–5.3)
Sodium: 141 mEq/L (ref 137–147)

## 2013-07-17 LAB — CBC
HCT: 39.9 % (ref 39.0–52.0)
Hemoglobin: 13.9 g/dL (ref 13.0–17.0)
MCH: 32.9 pg (ref 26.0–34.0)
MCHC: 34.8 g/dL (ref 30.0–36.0)
MCV: 94.3 fL (ref 78.0–100.0)
PLATELETS: 220 10*3/uL (ref 150–400)
RBC: 4.23 MIL/uL (ref 4.22–5.81)
RDW: 13.3 % (ref 11.5–15.5)
WBC: 11.1 10*3/uL — ABNORMAL HIGH (ref 4.0–10.5)

## 2013-07-17 MED ORDER — IPRATROPIUM BROMIDE 0.02 % IN SOLN
0.5000 mg | Freq: Once | RESPIRATORY_TRACT | Status: AC
  Administered 2013-07-17: 0.5 mg via RESPIRATORY_TRACT
  Filled 2013-07-17: qty 2.5

## 2013-07-17 MED ORDER — PREDNISONE 10 MG PO TABS: 20.0000 mg | ORAL_TABLET | Freq: Every day | ORAL | Status: DC

## 2013-07-17 MED ORDER — ALBUTEROL SULFATE (2.5 MG/3ML) 0.083% IN NEBU
5.0000 mg | INHALATION_SOLUTION | Freq: Once | RESPIRATORY_TRACT | Status: AC
  Administered 2013-07-17: 5 mg via RESPIRATORY_TRACT
  Filled 2013-07-17: qty 6

## 2013-07-17 MED ORDER — PREDNISONE 20 MG PO TABS
60.0000 mg | ORAL_TABLET | Freq: Once | ORAL | Status: AC
  Administered 2013-07-17: 60 mg via ORAL
  Filled 2013-07-17: qty 3

## 2013-07-17 MED ORDER — ALBUTEROL SULFATE HFA 108 (90 BASE) MCG/ACT IN AERS
2.0000 | INHALATION_SPRAY | RESPIRATORY_TRACT | Status: DC | PRN
  Filled 2013-07-17: qty 6.7

## 2013-07-17 MED ORDER — AEROCHAMBER PLUS FLO-VU LARGE MISC
1.0000 | Freq: Once | Status: AC
  Administered 2013-07-17: 1
  Filled 2013-07-17: qty 1

## 2013-07-17 MED ORDER — IPRATROPIUM BROMIDE 0.02 % IN SOLN
RESPIRATORY_TRACT | Status: AC
  Filled 2013-07-17: qty 2.5

## 2013-07-17 NOTE — Discharge Instructions (Signed)
Asthma, Acute Bronchospasm °Acute bronchospasm caused by asthma is also referred to as an asthma attack. Bronchospasm means your air passages become narrowed. The narrowing is caused by inflammation and tightening of the muscles in the air tubes (bronchi) in your lungs. This can make it hard to breath or cause you to wheeze and cough. °CAUSES °Possible triggers are: °· Animal dander from the skin, hair, or feathers of animals. °· Dust mites contained in house dust. °· Cockroaches. °· Pollen from trees or grass. °· Mold. °· Cigarette or tobacco smoke. °· Air pollutants such as dust, household cleaners, hair sprays, aerosol sprays, paint fumes, strong chemicals, or strong odors. °· Cold air or weather changes. Cold air may trigger inflammation. Winds increase molds and pollens in the air. °· Strong emotions such as crying or laughing hard. °· Stress. °· Certain medicines such as aspirin or beta-blockers. °· Sulfites in foods and drinks, such as dried fruits and wine. °· Infections or inflammatory conditions, such as a flu, cold, or inflammation of the nasal membranes (rhinitis). °· Gastroesophageal reflux disease (GERD). GERD is a condition where stomach acid backs up into your throat (esophagus). °· Exercise or strenuous activity. °SIGNS AND SYMPTOMS  °· Wheezing. °· Excessive coughing, particularly at night. °· Chest tightness. °· Shortness of breath. °DIAGNOSIS  °Your health care provider will ask you about your medical history and perform a physical exam. A chest X-ray or blood testing may be performed to look for other causes of your symptoms or other conditions that may have triggered your asthma attack.  °TREATMENT  °Treatment is aimed at reducing inflammation and opening up the airways in your lungs.  Most asthma attacks are treated with inhaled medicines. These include quick relief or rescue medicines (such as bronchodilators) and controller medicines (such as inhaled corticosteroids). These medicines are  sometimes given through an inhaler or a nebulizer. Systemic steroid medicine taken by mouth or given through an IV tube also can be used to reduce the inflammation when an attack is moderate or severe. Antibiotic medicines are only used if a bacterial infection is present.  °HOME CARE INSTRUCTIONS  °· Rest. °· Drink plenty of liquids. This helps the mucus to remain thin and be easily coughed up. Only use caffeine in moderation and do not use alcohol until you have recovered from your illness. °· Do not smoke. Avoid being exposed to secondhand smoke. °· You play a critical role in keeping yourself in good health. Avoid exposure to things that cause you to wheeze or to have breathing problems. °· Keep your medicines up to date and available. Carefully follow your health care provider's treatment plan. °· Take your medicine exactly as prescribed. °· When pollen or pollution is bad, keep windows closed and use an air conditioner or go to places with air conditioning. °· Asthma requires careful medical care. See your health care provider for a follow-up as advised. If you are more than [redacted] weeks pregnant and you were prescribed any new medicines, let your obstetrician know about the visit and how you are doing. Follow-up with your health care provider as directed. °· After you have recovered from your asthma attack, make an appointment with your outpatient doctor to talk about ways to reduce the likelihood of future attacks. If you do not have a doctor who manages your asthma, make an appointment with a primary care doctor to discuss your asthma. °SEEK IMMEDIATE MEDICAL CARE IF:  °· You are getting worse. °· You have trouble breathing. If severe, call   your local emergency services (911 in the U.S.).  You develop chest pain or discomfort.  You are vomiting.  You are not able to keep fluids down.  You are coughing up yellow, green, brown, or bloody sputum.  You have a fever and your symptoms suddenly get  worse.  You have trouble swallowing. MAKE SURE YOU:   Understand these instructions.  Will watch your condition.  Will get help right away if you are not doing well or get worse. Document Released: 09/30/2006 Document Revised: 02/15/2013 Document Reviewed: 12/21/2012 [facility] Patient Information 2014 [place], [place]. As discussed please take the steroids starting tomorrow, on a regular basis until all tablets have been completed if also been provided with an inhaler, use.  It with the AeroChamber 2 puffs every 4-6 hours on a regular basis for the next 2 days while awake and then as needed.  Thereafter.  You've also been given a Pharmacist, hospital that, you can use to help find a primary care provider   Emergency Department Resource Guide 1) Find a Doctor and Pay Out of Pocket Although you won't have to find out who is covered by your insurance plan, it is a good idea to ask around and get recommendations. You will then need to call the office and see if the doctor you have chosen will accept you as a new patient and what types of options they offer for patients who are self-pay. Some doctors offer discounts or will set up payment plans for their patients who do not have insurance, but you will need to ask so you aren't surprised when you get to your appointment.  2) Contact Your Local Health Department Not all health departments have doctors that can see patients for sick visits, but many do, so it is worth a call to see if yours does. If you don't know where your local health department is, you can check in your phone book. The CDC also has a tool to help you locate your state's health department, and many state websites also have listings of all of their local health departments.  3) Find a Walk-in Clinic If your illness is not likely to be very severe or complicated, you may want to try a walk in clinic. These are popping up all over the country in pharmacies, drugstores, and shopping centers.  They're usually staffed by nurse practitioners or physician assistants that have been trained to treat common illnesses and complaints. They're usually fairly quick and inexpensive. However, if you have serious medical issues or chronic medical problems, these are probably not your best option.  No Primary Care Doctor: - Call Health Connect at  [phone] - they can help you locate a primary care doctor that  accepts your insurance, provides certain services, etc. - Physician Referral Service- [phone]  Chronic Pain Problems: Organization         Address  Phone   Notes  [facility]  [phone] Patients need to be referred by their primary care doctor.   Medication Assistance: Organization         Address  Phone   Notes  [facility] Medication [facility] [address] [place] [place], [place] [address] [phone] --Must be a resident of [facility] -- Must have NO insurance coverage whatsoever (no Medicaid/ Medicare, etc.) -- The pt. MUST have a primary care doctor that directs their care regularly and follows them in the community   [facility]ist  [phone]  [employer]  [phone]    Agencies that provide inexpensive medical care: Organization         Address  Phone   Notes  [facility]  [phone]   [facility]    [phone]   [facility] [address] [place], [place] [address] [phone]   [facility] [address] [place] [place] [phone]   Planned Parenthood    [phone]   [facility]    [phone]   [facility]  [address], [place] Phone:  [phone], Fax:  [phone] Hours of Operation:  9 am - 6 pm, M-F.  Also accepts Medicaid/Medicare and self-pay.  [facility]  [address], [place] Phone: [phone], Fax: [phone]. Hours of Operation:  8:30 am - 5:30 pm, M-F.  Also accepts Medicaid and self-pay.  [facility] [place] [address] [place] Phone: [phone]   [facility] [address] Natasha Bence [place], [place] [phone], Ext. [phone] Mondays & Thursdays: 7-9 AM.  First 15 patients are seen on a first come, first serve basis.    Medicaid-accepting [facility] Providers:  Organization         Address  Phone   Notes  [facility] [address], [place] [phone] Also accepts self-pay patients.  [facility] [address] Laurell Josephs [place], [place]  [phone]   [facility] [address], [place] [phone]   [facility] [address], [place] [phone]   Renaye Rakers [address], [place]   [phone] Only accepts [place] Access [place] patients after they have their name applied to their card.   Self-Pay (no insurance) in [facility]:  Organization         Address  Phone   Notes  Sickle Cell Patients, [facility] [address] [place], [place] [phone]   [facility] [address] [place], [place] [phone]   [facility] [place]  [address], [place] [phone]   [facility]/Dr. Osei-Bonsu  [address] [place] or [address], [place] [phone] Phone number for both [place] and [place] locations is the same.  [facility] [address] [place] [phone]   [facility] [address], [place] or [address] [phone]   [facility] [address] [place] [phone], phone; [phone], fax Sees patients 1st and 3rd Saturday of every month.  Must not qualify for public or private insurance (i.e.  Medicaid, Medicare, [place] Health Choice, Veterans' Benefits)  Household income should be no more than 200% of the poverty level The clinic cannot treat you if you are pregnant or think you are pregnant  Sexually transmitted diseases are not treated at the clinic.    Dental Care: Organization         Address  Phone  Notes  [facility] Department of [facility] [address] [place], [place] [phone] Accepts children up to age 52 who are enrolled in [place] or [place] Health Choice; pregnant women with a Medicaid card;  and children who have applied for Medicaid or [place] Health Choice, but were declined, whose parents can pay a reduced fee at time of service.  [facility] [place]  [address], [place] [phone] Accepts children up to age 37 who are enrolled in [place] or [place] Health Choice; pregnant women with a Medicaid card; and children who have applied for Medicaid or [place] Health Choice, but were declined, whose parents can pay a reduced fee at time of service.  [facility]  [address], [place] [phone] Patients are seen by appointment only. Walk-ins are not accepted. [facility] will see patients 37 years of age and older. Monday - Tuesday (8am-5pm) Most Wednesdays (8:30-5pm) $30 per visit, cash only  [facility]  [address], [facility] [phone] Patients are seen by appointment only. Walk-ins are not accepted. [facility] will see patients 37 years of age and older. One Wednesday Evening (Monthly: Volunteer Based).  $30 per visit, cash only  [employer] [facility] [facility]  [phone] for adults; Children under age 614, call Graduate Pediatric Dentistry at [phone]. Children aged 424-14, please call [phone] to request a pediatric application.  Dental services are provided in all areas of dental care including fillings,  crowns and bridges, complete and partial dentures, implants, gum treatment, root canals, and extractions. Preventive care is also provided. Treatment is provided to both adults and children. Patients are selected via a lottery and there is often a waiting list.   [facility] [address], [place]  [phone] [email or web address]   [facility] [address], [place], [place] [phone], Ext. [phone] Second and Fourth Thursday of each month, opens at 6:30 AM; Clinic ends at 9 AM.  Patients are seen on a first-come first-served basis, and a limited number are seen during each clinic.   [facility]  [address], [place], [place] [phone]   Eligibility Requirements You must have lived in [place], [place], or [place] counties for at least the last three months.   You cannot be eligible for state or federal sponsored [place] Cityhealthcare insurance, including CIGNAVeterans Administration, [place]icaid, or [employer]icare.   You generally cannot be eligible for healthcare insurance through your employer.    How to apply: Eligibility screenings are held every Tuesday and Wednesday afternoon from 1:00 pm until 4:00 pm. You do not need an appointment for the interview!  [facility] [address], [place], [place] [phone]   [facility]  [phone]   [facility]  [phone]   [facility]  [phone]    Behavioral Health Resources in the Community: Intensive Outpatient Programs Organization         Address  Phone  Notes  [facility] [address], [place], [place] [phone]   [facility] [address], [place], [place] [phone]   [facility] [address], [place], [place]  [phone]   [facility] [address],  [place], [place] [phone] or [phone]   Substance Abuse  Resources Organization         Address  Phone  Notes  [facility] Drug Services  [phone]   [facility]  [phone]   [facility]  [phone]   [facility]  [phone]  Residential & Outpatient Substance Abuse Program  [phone]   Psychological Services Organization         Address  Phone  Notes  [facility]  [phone]   [facility]  [phone]   [facility] [phone] [address], [place] [phone] or [phone]    Mobile Crisis Teams Organization         Address  Phone  Notes  [facility], Mobile Crisis Care Unit  [phone]   [facility]  [address] [place], [place] [phone]   Doristine Locks [address] [place] [phone]    Self-Help/Support Groups Organization         Address  Phone             Notes  [facility] [place] - variety of support groups  [phone]- [phone] Call for more information  Narcotics Anonymous (NA), [facility] [address], [employer] [place]  2 meetings at this location   Statistician         Address  Phone  Notes  [facility] [address],    [place]  [phone]   [facility]  [address], [place] [address], [place], [place] [phone]   [facility] [address] [place], [place] [phone] Admissions: 8am-3pm M-F  [facility] [address],    [place], [place] [phone]   [facility] [address] [place], [place], [place] [phone]   [facility] [address]  [place], [place] [phone]   [facility] - Intensive Outpatient [address]ure[address], [place], [place] [phone]   [facility] ([facility] [address] [place]  [place], [place] [phone] or [phone]   [facility] ([facility]) [address], [place], [place] [phone] Accepts Medicaid  [facility] [address],  [place] [phone] Substance Abuse/Addiction Treatment   [facility] Organization         Address  Phone  Notes  [facility]  [phone]   Angie Fava, PhD [address] Ervin Knack [place], [place]   [phone] or [phone]   [facility]   [address] [place], [place] [phone]   [facility] [address], [place], [place] [phone] Insurance/Medicaid/sponsorship through [facility] and Families [address]                                    [place], [place] [phone] Therapy/tele-psych/case  [facility] [address], [place] [phone]    Dr. Lolly Mustache  [phone]   [facility]  [employer] [facility] Dept. 1) [address],  2) [address] [place] 3)  [address], [place] [phone]  [phone]   [facility] Child Abuse Hotline [phone] or [phone] (After Hours)

## 2013-07-17 NOTE — ED Notes (Signed)
Pt is here with chest pain, cough, shortness of breath, coughing up green/yellow sputum for last 2 weeks.

## 2013-07-17 NOTE — ED Notes (Signed)
NP at bedside.

## 2013-07-17 NOTE — ED Provider Notes (Signed)
Medical screening examination/treatment/procedure(s) were performed by non-physician practitioner and as supervising physician I was immediately available for consultation/collaboration.  EKG Interpretation    Date/Time:  Monday July 17 2013 17:37:40 EST Ventricular Rate:  97 PR Interval:  150 QRS Duration: 80 QT Interval:  326 QTC Calculation: 414 R Axis:   86 Text Interpretation:  Normal sinus rhythm Normal ECG When compared with ECG of 12/07/2010, No significant change was found Confirmed by Preston FleetingGLICK  MD, DAVID ([id number]) on 07/17/2013 5:41:16 PM             Jesse Lyonsouglas Kinsley Holderman, MD 07/17/13 2324

## 2013-07-17 NOTE — ED Provider Notes (Signed)
CSN: [id number]     Arrival date & time 07/17/13  1732 History   First MD Initiated Contact with Patient 07/17/13 2022     Chief Complaint  Patient presents with  . Chest Pain  . Back Pain  . Shortness of Breath   (Consider location/radiation/quality/duration/timing/severity/associated sxs/prior Treatment) HPI Comments: Patient states he has a remote history of, asthma as a child, but has not needed to use an inhaler in many years.  Approximately 2 weeks, ago, he started with URI symptoms, cough, chest tightness, wheezing.  That has not been relieved by any over-the-counter medications  Patient is a 38 y.o. male presenting with chest pain, back pain, and shortness of breath. The history is provided by the patient.  Chest Pain Pain location:  L chest and R chest Pain quality: aching and tightness   Pain radiates to:  Does not radiate Pain radiates to the back: yes   Pain severity:  Moderate Onset quality:  Gradual Duration:  2 weeks Timing:  Constant Progression:  Worsening Chronicity:  New Context: breathing   Relieved by:  Nothing Worsened by:  Nothing tried Associated symptoms: back pain, cough and shortness of breath   Associated symptoms: no fever   Back Pain Associated symptoms: chest pain   Associated symptoms: no fever   Shortness of Breath Associated symptoms: chest pain and cough   Associated symptoms: no fever     Past Medical History  Diagnosis Date  . Asthma in pediatric patient     Asthma as a child   History reviewed. No pertinent past surgical history. No family history on file. History  Substance Use Topics  . Smoking status: Current Every Day Smoker -- 1.00 packs/day    Types: Cigarettes  . Smokeless tobacco: Not on file  . Alcohol Use: No    Review of Systems  Constitutional: Negative for fever and chills.  Respiratory: Positive for cough and shortness of breath.   Cardiovascular: Positive for chest pain.  Musculoskeletal: Positive for back  pain.  All other systems reviewed and are negative.    Allergies  Sulfa antibiotics  Home Medications   Current Outpatient Rx  Name  Route  Sig  Dispense  Refill  . ibuprofen (ADVIL,MOTRIN) 200 MG tablet   Oral   Take 400 mg by mouth every 6 (six) hours as needed.         Marland Kitchen Phenylephrine-Pheniramine-DM (THERAFLU COLD & COUGH PO)   Oral   Take 1 packet by mouth every 6 (six) hours as needed.         . predniSONE (DELTASONE) 10 MG tablet   Oral   Take 2 tablets (20 mg total) by mouth daily.   15 tablet   0    BP 127/64  Pulse 100  Temp(Src) 98.8 F (37.1 C) (Oral)  Resp 22  Ht 6\' 2"  (1.88 m)  Wt 220 lb 3 oz (99.876 kg)  BMI 28.26 kg/m2  SpO2 99% Physical Exam  Nursing note and vitals reviewed. Constitutional: He is oriented to person, place, and time. He appears well-developed and well-nourished.  HENT:  Head: Normocephalic and atraumatic.  Eyes: Pupils are equal, round, and reactive to light.  Neck: Normal range of motion.  Cardiovascular: Normal rate and regular rhythm.   Pulmonary/Chest: Effort normal. He has wheezes. He exhibits no tenderness.  Musculoskeletal: Normal range of motion.  Neurological: He is alert and oriented to person, place, and time.  Skin: Skin is warm and dry.    ED  Course  Procedures (including critical care time) Labs Review Labs Reviewed  CBC - Abnormal; Notable for the following:    WBC 11.1 (*)    All other components within normal limits  BASIC METABOLIC PANEL   Imaging Review Dg Chest 2 View  07/17/2013   CLINICAL DATA:  Mid sternal chest pain, tightness, productive cough, shortness of breath, current smoker  EXAM: CHEST  2 VIEW  COMPARISON:  12/07/2010  FINDINGS: Grossly unchanged cardiac silhouette and mediastinal contours. The lungs appear mildly hyperexpanded. There is mild diffuse slightly nodular thickening of the pulmonary interstitium. No focal airspace opacities. No pleural effusion or pneumothorax. No evidence of  edema. No acute osseus abnormalities.  IMPRESSION: Mild lung hyperexpansion and bronchitic change without acute cardiopulmonary disease.   Electronically Signed   By: Simonne ComeJohn  Watts M.D.   On: 07/17/2013 19:30    EKG Interpretation    Date/Time:  Monday July 17 2013 17:37:40 EST Ventricular Rate:  97 PR Interval:  150 QRS Duration: 80 QT Interval:  326 QTC Calculation: 414 R Axis:   86 Text Interpretation:  Normal sinus rhythm Normal ECG When compared with ECG of 12/07/2010, No significant change was found Confirmed by [facility]  MD, DAVID ([id number]) on 07/17/2013 5:41:16 PM            MDM   1. Asthmatic bronchitis     Will start patient on albuterol, Atrovent neb in the emergency apartment, as well as by mouth steroids, and reevaluate and monitor After second albuterol treatment.  Patient is feeling much better.  He is been sent home with an albuterol inhaler AeroChamber and a prescription for steroids.  That he will start tomorrow with the following instructions.  2 puffs every 4-6 hours while awake for the next 2 days and then as needed.  He is to complete all of the prednisone, tablets he's also been given a resource list to help them establish with a primary care provider   Arman FilterGail K Alix Stowers, NP 07/17/13 2316

## 2015-11-19 ENCOUNTER — Emergency Department (HOSPITAL_COMMUNITY)
Admission: EM | Admit: 2015-11-19 | Discharge: 2015-11-19 | Disposition: A | Discharge status: Home / Self Care | Payer: Self-pay | Attending: Emergency Medicine | Admitting: Emergency Medicine

## 2015-11-19 DIAGNOSIS — J45909 Unspecified asthma, uncomplicated: Secondary | ICD-10-CM | POA: Insufficient documentation

## 2015-11-19 DIAGNOSIS — F17210 Nicotine dependence, cigarettes, uncomplicated: Secondary | ICD-10-CM | POA: Insufficient documentation

## 2015-11-19 DIAGNOSIS — L03116 Cellulitis of left lower limb: Secondary | ICD-10-CM | POA: Insufficient documentation

## 2015-11-19 DIAGNOSIS — L03115 Cellulitis of right lower limb: Secondary | ICD-10-CM

## 2015-11-19 DIAGNOSIS — Z79899 Other long term (current) drug therapy: Secondary | ICD-10-CM | POA: Insufficient documentation

## 2015-11-19 MED ORDER — CEPHALEXIN 500 MG PO CAPS
500.0000 mg | ORAL_CAPSULE | Freq: Once | ORAL | Status: AC
  Administered 2015-11-19: 500 mg via ORAL
  Filled 2015-11-19: qty 1

## 2015-11-19 MED ORDER — DOXYCYCLINE HYCLATE 100 MG PO TABS
100.0000 mg | ORAL_TABLET | Freq: Once | ORAL | Status: AC
  Administered 2015-11-19: 100 mg via ORAL
  Filled 2015-11-19: qty 1

## 2015-11-19 MED ORDER — DOXYCYCLINE HYCLATE 100 MG PO CAPS: 100.0000 mg | ORAL_CAPSULE | Freq: Two times a day (BID) | ORAL | Status: DC

## 2015-11-19 MED ORDER — CEPHALEXIN 500 MG PO CAPS: 500.0000 mg | ORAL_CAPSULE | Freq: Two times a day (BID) | ORAL | Status: DC

## 2015-11-19 NOTE — Discharge Instructions (Signed)
Cellulitis Mr. Casino, take antibiotics as directed and see a primary care doctor within 3 days for wound check.  If any symptoms worsen, come back to the ED immediately. Thank you.   Cellulitis is an infection of the skin and the tissue under the skin. The infected area is usually red and tender. This happens most often in the arms and lower legs. HOME CARE   Take your antibiotic medicine as told. Finish the medicine even if you start to feel better.  Keep the infected arm or leg raised (elevated).  Put a warm cloth on the area up to 4 times per day.  Only take medicines as told by your doctor.  Keep all doctor visits as told. GET HELP IF:  You see red streaks on the skin coming from the infected area.  Your red area gets bigger or turns a dark color.  Your bone or joint under the infected area is painful after the skin heals.  Your infection comes back in the same area or different area.  You have a puffy (swollen) bump in the infected area.  You have new symptoms.  You have a fever. GET HELP RIGHT AWAY IF:   You feel very sleepy.  You throw up (vomit) or have watery poop (diarrhea).  You feel sick and have muscle aches and pains.   This information is not intended to replace advice given to you by your health care provider. Make sure you discuss any questions you have with your health care provider.   Document Released: 12/02/2007 Document Revised: 03/06/2015 Document Reviewed: 08/31/2011 Elsevier Interactive Patient Education [employer]2016 Elsevier Inc.

## 2015-11-19 NOTE — ED Provider Notes (Signed)
CSN: [id number]     Arrival date & time 11/19/15  0010 History   First MD Initiated Contact with Patient 11/19/15 0502     Chief Complaint  Patient presents with  . Cellulitis     (Consider location/radiation/quality/duration/timing/severity/associated sxs/prior Treatment) HPI [name] is a 40 y.o. male with no significant past medical history presenting today for a rash on his left leg. [place] He noticed an area that he is concerned for spider bite. He had increasing redness and pain from that area. No drainage. No fevers. There are no further complaints.  10 Systems reviewed and are negative for acute change except as noted in the HPI.     Past Medical History  Diagnosis Date  . Asthma in pediatric patient     Asthma as a child   No past surgical history on file. No family history on file. Social History  Substance Use Topics  . Smoking status: Current Every Day Smoker -- 1.00 packs/day    Types: Cigarettes  . Smokeless tobacco: Not on file  . Alcohol Use: No    Review of Systems    Allergies  Sulfa antibiotics  Home Medications   Prior to Admission medications   Medication Sig Start Date End Date Taking? Authorizing Provider  ibuprofen (ADVIL,MOTRIN) 200 MG tablet Take 400 mg by mouth every 6 (six) hours as needed.   Yes Historical Provider, MD  cephALEXin (KEFLEX) 500 MG capsule Take 1 capsule (500 mg total) by mouth 2 (two) times daily. 11/19/15   Tomasita CrumbleAdeleke Rochell Mabie, MD  doxycycline (VIBRAMYCIN) 100 MG capsule Take 1 capsule (100 mg total) by mouth 2 (two) times daily. 11/19/15   Tomasita CrumbleAdeleke Atheena Spano, MD   BP 128/83 mmHg  Pulse 74  Temp(Src) 98.1 F (36.7 C) (Oral)  Resp 20  SpO2 97% Physical Exam  Constitutional: He is oriented to person, place, and time. Vital signs are normal. He appears well-developed and well-nourished.  Non-toxic appearance. He does not appear ill. No distress.  HENT:  Head: Normocephalic and  atraumatic.  Nose: Nose normal.  Mouth/Throat: Oropharynx is clear and moist. No oropharyngeal exudate.  Eyes: Conjunctivae and EOM are normal. Pupils are equal, round, and reactive to light. No scleral icterus.  Neck: Normal range of motion. Neck supple. No tracheal deviation, no edema, no erythema and normal range of motion present. No thyroid mass and no thyromegaly present.  Cardiovascular: Normal rate, regular rhythm, S1 normal, S2 normal, normal heart sounds, intact distal pulses and normal pulses.  Exam reveals no gallop and no friction rub.   No murmur heard. Pulmonary/Chest: Effort normal and breath sounds normal. No respiratory distress. He has no wheezes. He has no rhonchi. He has no rales.  Abdominal: Soft. Normal appearance and bowel sounds are normal. He exhibits no distension, no ascites and no mass. There is no hepatosplenomegaly. There is no tenderness. There is no rebound, no guarding and no CVA tenderness.  Musculoskeletal: Normal range of motion. He exhibits edema and tenderness.  Left lower extremity with erythema from the proximal to mid tibia. There is circumferential around the leg. No abscess or fluctuance to drain. There is warmth and tenderness to palpation. Normal pulses and sensation distally.  Lymphadenopathy:    He has no cervical adenopathy.  Neurological: He is alert and oriented to person, place, and time. He has normal strength. No cranial nerve deficit or sensory deficit.  Skin: Skin is warm, dry and intact. No  petechiae and no rash noted. He is not diaphoretic. There is erythema. No pallor.  Psychiatric: He has a normal mood and affect. His behavior is normal. Judgment normal.  Nursing note and vitals reviewed.   ED Course  Procedures (including critical care time) Labs Review Labs Reviewed - No data to display  Imaging Review No results found. I have personally reviewed and evaluated these images and lab results as part of my medical  decision-making.   EKG Interpretation None      MDM   Final diagnoses:  Cellulitis of right lower extremity    Patient presents emergency department for cellulitis of the left leg. We'll place on Keflex and doxycycline. Wound check advised in 3 days. He appears well in no acute distress, vital signs were within his normal limits and he is safe for discharge.    Tomasita Crumble, MD 11/19/15 [phone]

## 2015-11-19 NOTE — ED Notes (Signed)
Pt states that [place] [facility] [place] and noticed an area resembling a bite on the back of his L leg on Saturday. Leg is now swollen and has redness spreading. Alert and oriented.

## 2016-01-06 ENCOUNTER — Emergency Department (HOSPITAL_COMMUNITY)
Admission: EM | Admit: 2016-01-06 | Discharge: 2016-01-06 | Disposition: A | Discharge status: Home / Self Care | Payer: Self-pay | Attending: Physician Assistant | Admitting: Physician Assistant

## 2016-01-06 ENCOUNTER — Encounter (HOSPITAL_COMMUNITY): Payer: Self-pay | Admitting: *Deleted

## 2016-01-06 ENCOUNTER — Emergency Department (HOSPITAL_COMMUNITY): Payer: Self-pay

## 2016-01-06 DIAGNOSIS — J40 Bronchitis, not specified as acute or chronic: Secondary | ICD-10-CM | POA: Insufficient documentation

## 2016-01-06 DIAGNOSIS — F17210 Nicotine dependence, cigarettes, uncomplicated: Secondary | ICD-10-CM | POA: Insufficient documentation

## 2016-01-06 MED ORDER — ALBUTEROL SULFATE HFA 108 (90 BASE) MCG/ACT IN AERS
2.0000 | INHALATION_SPRAY | Freq: Once | RESPIRATORY_TRACT | Status: AC
  Administered 2016-01-06: 2 via RESPIRATORY_TRACT
  Filled 2016-01-06: qty 6.7

## 2016-01-06 MED ORDER — PREDNISONE 20 MG PO TABS: ORAL_TABLET | ORAL | Status: AC

## 2016-01-06 MED ORDER — ALBUTEROL SULFATE HFA 108 (90 BASE) MCG/ACT IN AERS
1.0000 | INHALATION_SPRAY | Freq: Four times a day (QID) | RESPIRATORY_TRACT | Status: AC | PRN
Start: 2016-01-06 — End: ?

## 2016-01-06 MED ORDER — PREDNISONE 20 MG PO TABS
60.0000 mg | ORAL_TABLET | Freq: Once | ORAL | Status: AC
  Administered 2016-01-06: 60 mg via ORAL
  Filled 2016-01-06: qty 3

## 2016-01-06 MED ORDER — AZITHROMYCIN 250 MG PO TABS: ORAL_TABLET | ORAL | Status: DC

## 2016-01-06 MED ORDER — IPRATROPIUM-ALBUTEROL 0.5-2.5 (3) MG/3ML IN SOLN
3.0000 mL | Freq: Once | RESPIRATORY_TRACT | Status: AC
  Administered 2016-01-06: 3 mL via RESPIRATORY_TRACT
  Filled 2016-01-06: qty 3

## 2016-01-06 MED ORDER — AZITHROMYCIN 250 MG PO TABS
500.0000 mg | ORAL_TABLET | Freq: Once | ORAL | Status: AC
  Administered 2016-01-06: 500 mg via ORAL
  Filled 2016-01-06: qty 2

## 2016-01-06 NOTE — Discharge Instructions (Signed)
Upper Respiratory Infection, Adult °Most upper respiratory infections (URIs) are a viral infection of the air passages leading to the lungs. A URI affects the nose, throat, and upper air passages. The most common type of URI is nasopharyngitis and is typically referred to as "the common cold." °URIs run their course and usually go away on their own. Most of the time, a URI does not require medical attention, but sometimes a bacterial infection in the upper airways can follow a viral infection. This is called a secondary infection. Sinus and middle ear infections are common types of secondary upper respiratory infections. °Bacterial pneumonia can also complicate a URI. A URI can worsen asthma and chronic obstructive pulmonary disease (COPD). Sometimes, these complications can require emergency medical care and may be life threatening.  °CAUSES °Almost all URIs are caused by viruses. A virus is a type of germ and can spread from one person to another.  °RISKS FACTORS °You may be at risk for a URI if:  °1. You smoke.   °2. You have chronic heart or lung disease. °3. You have a weakened defense (immune) system.   °4. You are very young or very old.   °5. You have nasal allergies or asthma. °6. You work in crowded or poorly ventilated areas. °7. You work in health care facilities or schools. °SIGNS AND SYMPTOMS  °Symptoms typically develop 2-3 days after you come in contact with a cold virus. Most viral URIs last 7-10 days. However, viral URIs from the influenza virus (flu virus) can last 14-18 days and are typically more severe. Symptoms may include:  °1. Runny or stuffy (congested) nose.   °2. Sneezing.   °3. Cough.   °4. Sore throat.   °5. Headache.   °6. Fatigue.   °7. Fever.   °8. Loss of appetite.   °9. Pain in your forehead, behind your eyes, and over your cheekbones (sinus pain). °10. Muscle aches.   °DIAGNOSIS  °Your health care provider may diagnose a URI by: °· Physical exam. °· Tests to check that your  symptoms are not due to another condition such as: °· Strep throat. °· Sinusitis. °· Pneumonia. °· Asthma. °TREATMENT  °A URI goes away on its own with time. It cannot be cured with medicines, but medicines may be prescribed or recommended to relieve symptoms. Medicines may help: °· Reduce your fever. °· Reduce your cough. °· Relieve nasal congestion. °HOME CARE INSTRUCTIONS  °· Take medicines only as directed by your health care provider.   °· Gargle warm saltwater or take cough drops to comfort your throat as directed by your health care provider. °· Use a warm mist humidifier or inhale steam from a shower to increase air moisture. This may make it easier to breathe. °· Drink enough fluid to keep your urine clear or pale yellow.   °· Eat soups and other clear broths and maintain good nutrition.   °· Rest as needed.   °· Return to work when your temperature has returned to normal or as your health care provider advises. You may need to stay home longer to avoid infecting others. You can also use a face mask and careful hand washing to prevent spread of the virus. °· Increase the usage of your inhaler if you have asthma.   °· Do not use any tobacco products, including cigarettes, chewing tobacco, or electronic cigarettes. If you need help quitting, ask your health care provider. °PREVENTION  °The best way to protect yourself from getting a cold is to practice good hygiene.  °· Avoid oral or hand contact with people with cold   symptoms.   °· Wash your hands often if contact occurs.   °There is no clear evidence that vitamin C, vitamin E, echinacea, or exercise reduces the chance of developing a cold. However, it is always recommended to get plenty of rest, exercise, and practice good nutrition.  °SEEK MEDICAL CARE IF:  °· You are getting worse rather than better.   °· Your symptoms are not controlled by medicine.   °· You have chills. °· You have worsening shortness of breath. °· You have brown or red mucus. °· You  have yellow or brown nasal discharge. °· You have pain in your face, especially when you bend forward. °· You have a fever. °· You have swollen neck glands. °· You have pain while swallowing. °· You have white areas in the back of your throat. °SEEK IMMEDIATE MEDICAL CARE IF:  °· You have severe or persistent: °¨ Headache. °¨ Ear pain. °¨ Sinus pain. °¨ Chest pain. °· You have chronic lung disease and any of the following: °¨ Wheezing. °¨ Prolonged cough. °¨ Coughing up blood. °¨ A change in your usual mucus. °· You have a stiff neck. °· You have changes in your: °¨ Vision. °¨ Hearing. °¨ Thinking. °¨ Mood. °MAKE SURE YOU:  °· Understand these instructions. °· Will watch your condition. °· Will get help right away if you are not doing well or get worse. °  °This information is not intended to replace advice given to you by your health care provider. Make sure you discuss any questions you have with your health care provider. °  °Document Released: 12/09/2000 Document Revised: 10/30/2014 Document Reviewed: 09/20/2013 °Elsevier Interactive Patient Education ©2016 Elsevier Inc. ° °How to Use an Inhaler °Proper inhaler technique is very important. Good technique ensures that the medicine reaches the lungs. Poor technique results in depositing the medicine on the tongue and back of the throat rather than in the airways. If you do not use the inhaler with good technique, the medicine will not help you. °STEPS TO FOLLOW IF USING AN INHALER WITHOUT AN EXTENSION TUBE °8. Remove the cap from the inhaler. °9. If you are using the inhaler for the first time, you will need to prime it. Shake the inhaler for 5 seconds and release four puffs into the air, away from your face. Ask your health care provider or pharmacist if you have questions about priming your inhaler. °10. Shake the inhaler for 5 seconds before each breath in (inhalation). °11. Position the inhaler so that the top of the canister faces up. °12. Put your index  finger on the top of the medicine canister. Your thumb supports the bottom of the inhaler. °13. Open your mouth. °14. Either place the inhaler between your teeth and place your lips tightly around the mouthpiece, or hold the inhaler 1-2 inches away from your open mouth. If you are unsure of which technique to use, ask your health care provider. °15. Breathe out (exhale) normally and as completely as possible. °16. Press the canister down with your index finger to release the medicine. °17. At the same time as the canister is pressed, inhale deeply and slowly until your lungs are completely filled. This should take 4-6 seconds. Keep your tongue down. °18. Hold the medicine in your lungs for 5-10 seconds (10 seconds is best). This helps the medicine get into the small airways of your lungs. °19. Breathe out slowly, through pursed lips. Whistling is an example of pursed lips. °20. Wait at least 15-30 seconds between puffs. Continue   with the above steps until you have taken the number of puffs your health care provider has ordered. Do not use the inhaler more than your health care provider tells you. °21. Replace the cap on the inhaler. °22. Follow the directions from your health care provider or the inhaler insert for cleaning the inhaler. °STEPS TO FOLLOW IF USING AN INHALER WITH AN EXTENSION (SPACER) °11. Remove the cap from the inhaler. °12. If you are using the inhaler for the first time, you will need to prime it. Shake the inhaler for 5 seconds and release four puffs into the air, away from your face. Ask your health care provider or pharmacist if you have questions about priming your inhaler. °13. Shake the inhaler for 5 seconds before each breath in (inhalation). °14. Place the open end of the spacer onto the mouthpiece of the inhaler. °15. Position the inhaler so that the top of the canister faces up and the spacer mouthpiece faces you. °16. Put your index finger on the top of the medicine canister. Your thumb  supports the bottom of the inhaler and the spacer. °17. Breathe out (exhale) normally and as completely as possible. °18. Immediately after exhaling, place the spacer between your teeth and into your mouth. Close your lips tightly around the spacer. °19. Press the canister down with your index finger to release the medicine. °20. At the same time as the canister is pressed, inhale deeply and slowly until your lungs are completely filled. This should take 4-6 seconds. Keep your tongue down and out of the way. °21. Hold the medicine in your lungs for 5-10 seconds (10 seconds is best). This helps the medicine get into the small airways of your lungs. Exhale. °22. Repeat inhaling deeply through the spacer mouthpiece. Again hold that breath for up to 10 seconds (10 seconds is best). Exhale slowly. If it is difficult to take this second deep breath through the spacer, breathe normally several times through the spacer. Remove the spacer from your mouth. °23. Wait at least 15-30 seconds between puffs. Continue with the above steps until you have taken the number of puffs your health care provider has ordered. Do not use the inhaler more than your health care provider tells you. °24. Remove the spacer from the inhaler, and place the cap on the inhaler. °25. Follow the directions from your health care provider or the inhaler insert for cleaning the inhaler and spacer. °If you are using different kinds of inhalers, use your quick relief medicine to open the airways 10-15 minutes before using a steroid if instructed to do so by your health care provider. If you are unsure which inhalers to use and the order of using them, ask your health care provider, nurse, or respiratory therapist. °If you are using a steroid inhaler, always rinse your mouth with water after your last puff, then gargle and spit out the water. Do not swallow the water. °AVOID: °· Inhaling before or after starting the spray of medicine. It takes practice to  coordinate your breathing with triggering the spray. °· Inhaling through the nose (rather than the mouth) when triggering the spray. °HOW TO DETERMINE IF YOUR INHALER IS FULL OR NEARLY EMPTY °You cannot know when an inhaler is empty by shaking it. A few inhalers are now being made with dose counters. Ask your health care provider for a prescription that has a dose counter if you feel you need that extra help. If your inhaler does not have a counter,   ask your health care provider to help you determine the date you need to refill your inhaler. Write the refill date on a calendar or your inhaler canister. Refill your inhaler 7-10 days before it runs out. Be sure to keep an adequate supply of medicine. This includes making sure it is not expired, and that you have a spare inhaler.  °SEEK MEDICAL CARE IF:  °· Your symptoms are only partially relieved with your inhaler. °· You are having trouble using your inhaler. °· You have some increase in phlegm. °SEEK IMMEDIATE MEDICAL CARE IF:  °· You feel little or no relief with your inhalers. You are still wheezing and are feeling shortness of breath or tightness in your chest or both. °· You have dizziness, headaches, or a fast heart rate. °· You have chills, fever, or night sweats. °· You have a noticeable increase in phlegm production, or there is blood in the phlegm. °MAKE SURE YOU:  °· Understand these instructions. °· Will watch your condition. °· Will get help right away if you are not doing well or get worse. °  °This information is not intended to replace advice given to you by your health care provider. Make sure you discuss any questions you have with your health care provider. °  °Document Released: 06/12/2000 Document Revised: 04/05/2013 Document Reviewed: 01/12/2013 °Elsevier Interactive Patient Education ©2016 Else[employer] Inc. ° °

## 2016-01-06 NOTE — ED Notes (Signed)
PT thinks he has upper RI and states he smokes.  Pt states coughing every nite and has bronchial trouble.

## 2016-01-06 NOTE — ED Provider Notes (Signed)
CSN: [id number]     Arrival date & time 01/06/16  1626 History   First MD Initiated Contact with Patient 01/06/16 2132     Chief Complaint  Patient presents with  . Cough     (Consider location/radiation/quality/duration/timing/severity/associated sxs/prior Treatment) HPI   Patient is a 40 year old male who smokes 2 packs a day. He is presenting today with cough. He had a cough for the last 5-6 days. Increased sputum production. She has no risk factors for PE. Patient has noted wheezing. Patient's had treatment with albuterol for wheezing in the past.  Past Medical History  Diagnosis Date  . Asthma in pediatric patient     Asthma as a child   History reviewed. No pertinent past surgical history. No family history on file. Social History  Substance Use Topics  . Smoking status: Current Every Day Smoker -- 1.00 packs/day    Types: Cigarettes  . Smokeless tobacco: None  . Alcohol Use: No    Review of Systems  Constitutional: Negative for activity change.  Respiratory: Positive for cough and shortness of breath.   Cardiovascular: Negative for chest pain.  Gastrointestinal: Negative for abdominal pain.      Allergies  Sulfa antibiotics  Home Medications   Prior to Admission medications   Medication Sig Start Date End Date Taking? Authorizing Provider  ibuprofen (ADVIL,MOTRIN) 200 MG tablet Take 400 mg by mouth every 6 (six) hours as needed for headache, mild pain or moderate pain.    Yes Historical Provider, MD  cephALEXin (KEFLEX) 500 MG capsule Take 1 capsule (500 mg total) by mouth 2 (two) times daily. Patient not taking: Reported on 01/06/2016 11/19/15   Tomasita Crumble, MD  doxycycline (VIBRAMYCIN) 100 MG capsule Take 1 capsule (100 mg total) by mouth 2 (two) times daily. Patient not taking: Reported on 01/06/2016 11/19/15   Tomasita Crumble, MD   BP 120/64 mmHg  Pulse 101  Temp(Src) 98.6 F (37 C) (Oral)  Resp 22  SpO2 97% Physical Exam  Constitutional: He is oriented to  person, place, and time. He appears well-nourished.  HENT:  Head: Normocephalic.  Mouth/Throat: Oropharynx is clear and moist.  Eyes: Conjunctivae are normal.  Neck: No tracheal deviation present.  Cardiovascular: Normal rate.   Pulmonary/Chest: Effort normal. No stridor. No respiratory distress. He has wheezes.  Musculoskeletal: Normal range of motion. He exhibits no edema.  Neurological: He is oriented to person, place, and time. No cranial nerve deficit.  Skin: Skin is warm and dry. No rash noted. He is not diaphoretic.  Psychiatric: He has a normal mood and affect. His behavior is normal.  Nursing note and vitals reviewed.   ED Course  Procedures (including critical care time) Labs Review Labs Reviewed - No data to display  Imaging Review Dg Chest 2 View  01/06/2016  CLINICAL DATA:  Chest pain, shortness of breath and cough for the past 6 days. EXAM: CHEST  2 VIEW COMPARISON:  07/17/2013. FINDINGS: Normal sized heart. Clear lungs. Mild diffuse peribronchial thickening. Unremarkable bones. IMPRESSION: Stable mild chronic bronchitic changes.  No acute abnormality. Electronically Signed   By: Beckie Salts M.D.   On: 01/06/2016 18:01   I have personally reviewed and evaluated these images and lab results as part of my medical decision-making.   EKG Interpretation None      MDM   Final diagnoses:  None   Patient is a 40 year old male presenting with wheezing and cough. Patient's had the symptoms for last 4-5 days. Patient is a  heavy smoker. Negative chest x-ray we'll treat for bronchitis. We'll give DuoNeb here. We'll encourage him to take albuterol inhaler at home. We'll give him prednisone and azithromycin for home.    Levin Dagostino Randall AnLyn Betheny Suchecki, MD 01/06/16 2231

## 2016-01-09 ENCOUNTER — Telehealth: Payer: Self-pay | Admitting: Surgery

## 2016-01-09 NOTE — Telephone Encounter (Signed)
CM received call from pharmacist [facility] [place] [facility] [place] prescription clarification. CM provided clarification, no further questions or concerns verbalized.

## 2016-05-18 ENCOUNTER — Encounter (HOSPITAL_COMMUNITY): Payer: Self-pay | Admitting: Emergency Medicine

## 2016-05-18 ENCOUNTER — Ambulatory Visit (HOSPITAL_COMMUNITY)
Admission: EM | Admit: 2016-05-18 | Discharge: 2016-05-18 | Disposition: A | Discharge status: Home / Self Care | Payer: Self-pay | Attending: Emergency Medicine | Admitting: Emergency Medicine

## 2016-05-18 DIAGNOSIS — S61211A Laceration without foreign body of left index finger without damage to nail, initial encounter: Secondary | ICD-10-CM

## 2016-05-18 MED ORDER — IBUPROFEN 800 MG PO TABS
800.0000 mg | ORAL_TABLET | Freq: Once | ORAL | Status: AC
Start: 2016-05-18 — End: 2016-05-18
  Administered 2016-05-18: 800 mg via ORAL

## 2016-05-18 MED ORDER — LIDOCAINE HCL 2 % IJ SOLN
INTRAMUSCULAR | Status: AC
  Filled 2016-05-18: qty 20

## 2016-05-18 MED ORDER — IBUPROFEN 800 MG PO TABS
ORAL_TABLET | ORAL | Status: AC
  Filled 2016-05-18: qty 1

## 2016-05-18 MED ORDER — CEPHALEXIN 500 MG PO CAPS: 500.0000 mg | ORAL_CAPSULE | Freq: Three times a day (TID) | ORAL | 0 refills | Status: AC

## 2016-05-18 MED ORDER — HYDROCODONE-ACETAMINOPHEN 5-325 MG PO TABS
1.0000 | ORAL_TABLET | Freq: Four times a day (QID) | ORAL | 0 refills | Status: AC | PRN
Start: 2016-05-18 — End: ?

## 2016-05-18 NOTE — ED Provider Notes (Signed)
CSN: [id number]     Arrival date & time 05/18/16  1454 History   First MD Initiated Contact with Patient 05/18/16 1541     Chief Complaint  Patient presents with  . Laceration   (Consider location/radiation/quality/duration/timing/severity/associated sxs/prior Treatment) HPI  [name] is a 40 y.o. male presenting to UC with c/o laceration to his Left index finger that occurred around 8AM this morning. Pt notes he cut it on a cold door handle this morning.  He notes he did have to go to work so he wrapped his finger in a tight bandage but is concerned he does new sutures.  Pain is aching, 8/10.  No pain medication taken PTA.  Tdap was given last year.  No other injuries. He is not on blood thinners.    Past Medical History:  Diagnosis Date  . Asthma in pediatric patient    Asthma as a child   History reviewed. No pertinent surgical history. History reviewed. No pertinent family history. Social History  Substance Use Topics  . Smoking status: Current Every Day Smoker    Packs/day: 1.00    Types: Cigarettes  . Smokeless tobacco: Never Used  . Alcohol use No    Review of Systems  Musculoskeletal: Negative for arthralgias and myalgias.  Skin: Positive for wound. Negative for color change.       Left index finger  Neurological: Negative for weakness and numbness.    Allergies  Sulfa antibiotics  Home Medications   Prior to Admission medications   Medication Sig Start Date End Date Taking? Authorizing Provider  albuterol (PROVENTIL HFA;VENTOLIN HFA) 108 (90 Base) MCG/ACT inhaler Inhale 1-2 puffs into the lungs every 6 (six) hours as needed for wheezing or shortness of breath. 01/06/16   Courteney Lyn Mackuen, MD  cephALEXin (KEFLEX) 500 MG capsule Take 1 capsule (500 mg total) by mouth 3 (three) times daily. 05/18/16   Junius FinnerErin O'Malley, PA-C  HYDROcodone-acetaminophen (NORCO/VICODIN) 5-325 MG tablet Take 1-2 tablets by mouth every 6 (six) hours as needed for severe pain.  05/18/16   Junius FinnerErin O'Malley, PA-C  ibuprofen (ADVIL,MOTRIN) 200 MG tablet Take 400 mg by mouth every 6 (six) hours as needed for headache, mild pain or moderate pain.     Historical Provider, MD  predniSONE (DELTASONE) 20 MG tablet Day 1 and 2: Take 3 tabs  Day 3-5: Take 2 tabs.  Day 5-8: take 1 tab 01/06/16   Courteney Lyn Mackuen, MD   Meds Ordered and Administered this Visit   Medications  ibuprofen (ADVIL,MOTRIN) tablet 800 mg (800 mg Oral Given 05/18/16 1552)    BP 153/81 (BP Location: Right Arm)   Pulse 80   Temp 98.2 F (36.8 C) (Oral)   Resp 18   SpO2 99%  No data found.   Physical Exam  Constitutional: He is oriented to person, place, and time. He appears well-developed and well-nourished.  HENT:  Head: Normocephalic and atraumatic.  Eyes: EOM are normal.  Neck: Normal range of motion.  Cardiovascular: Normal rate.   Pulmonary/Chest: Effort normal.  Musculoskeletal: Normal range of motion. He exhibits tenderness. He exhibits no edema.  Left index finger: full ROM. Mild tenderness to medial phalanx (see skin exam)  Neurological: He is alert and oriented to person, place, and time.  Skin: Skin is warm and dry. Capillary refill takes less than 2 seconds.  Left index finger, middle phalanx: 2cm triangular shaped laceration on volar aspect close to PIP joint. Small amount of active bleeding. No foreign bodies  seen or palpated.   Psychiatric: He has a normal mood and affect. His behavior is normal.  Nursing note and vitals reviewed.   Urgent Care Course   Clinical Course     .Marland Kitchen.Laceration Repair Date/Time: 05/18/2016 4:22 PM Performed by: Junius Finner'MALLEY, Samvel Zinn Authorized by: Domenick GongMORTENSON, ASHLEY   Consent:    Consent obtained:  Verbal   Consent given by:  Patient   Risks discussed:  Infection, pain, poor cosmetic result and poor wound healing   Alternatives discussed:  No treatment and delayed treatment Anesthesia (see MAR for exact dosages):    Anesthesia method:  Nerve  block   Block location:  Digital   Block needle gauge:  27 G   Block anesthetic:  Lidocaine 2% w/o epi   Block injection procedure:  Introduced needle, anatomic landmarks identified, anatomic landmarks palpated, negative aspiration for blood and incremental injection   Block outcome:  Anesthesia achieved Laceration details:    Location:  Finger   Finger location:  L index finger   Length (cm):  2   Depth (mm):  2 Repair type:    Repair type:  Simple Pre-procedure details:    Preparation:  Patient was prepped and draped in usual sterile fashion Exploration:    Hemostasis achieved with:  Direct pressure   Wound exploration: wound explored through full range of motion and entire depth of wound probed and visualized     Wound extent: no foreign bodies/material noted, no muscle damage noted, no nerve damage noted, no tendon damage noted, no underlying fracture noted and no vascular damage noted     Contaminated: yes (hands covered with dirt or oil)   Treatment:    Area cleansed with:  Hibiclens and saline   Amount of cleaning:  Extensive   Irrigation solution:  Sterile saline   Irrigation volume:  20   Irrigation method:  Syringe   Visualized foreign bodies/material removed: yes (skin surrounding wound thoroughly cleansed.)   Skin repair:    Repair method:  Sutures   Suture size:  5-0   Suture material:  Prolene   Suture technique:  Simple interrupted   Number of sutures:  5 Approximation:    Approximation:  Close   Vermilion border: well-aligned   Post-procedure details:    Dressing:  Antibiotic ointment and non-adherent dressing   Patient tolerance of procedure:  Tolerated well, no immediate complications   (including critical care time)  Labs Review Labs Reviewed - No data to display  Imaging Review No results found.    MDM   1. Laceration of left index finger without foreign body without damage to nail, initial encounter    Laceration to Left index finger.  Injury  occurred ~8 hours PTA.  Due to location of wound with flap close to PIP joint, best to suture closed for healing purposes. Wound thoroughly cleaned.  Wound closed with sutures. Will place on Keflex to help prevent infection given hands appeared dirty and duration wound was left open.    Rx: Keflex and norco F/u with PCP or return to UC in 10-14 days for suture removal. Sooner if signs of infection.    Junius Finnerrin O'Malley, PA-C 05/18/16 1627

## 2016-05-18 NOTE — ED Triage Notes (Signed)
[facility] a [facility] a laceration to the pointer finger on his left hand that occurred today. The patient reported that he cut his finger on a metal door handle. The patient reported that his last TDAP booster was last year.

## 2016-05-18 NOTE — Discharge Instructions (Signed)
°  Norco/Vicodin (hydrocodone-acetaminophen) is a narcotic pain medication, do not combine these medications with others containing tylenol. While taking, do not drink alcohol, drive, or perform any other activities that requires focus while taking these medications.   Please take antibiotics as prescribed and be sure to complete entire course  to help prevent infection in your finger.

## 2016-08-31 ENCOUNTER — Emergency Department (HOSPITAL_COMMUNITY)
Admission: EM | Admit: 2016-08-31 | Discharge: 2016-08-31 | Disposition: A | Discharge status: Home / Self Care | Payer: 59 | Attending: Emergency Medicine | Admitting: Emergency Medicine

## 2016-08-31 ENCOUNTER — Encounter (HOSPITAL_COMMUNITY): Payer: Self-pay | Admitting: *Deleted

## 2016-08-31 DIAGNOSIS — K047 Periapical abscess without sinus: Secondary | ICD-10-CM | POA: Diagnosis not present

## 2016-08-31 DIAGNOSIS — Z79899 Other long term (current) drug therapy: Secondary | ICD-10-CM | POA: Insufficient documentation

## 2016-08-31 DIAGNOSIS — J45909 Unspecified asthma, uncomplicated: Secondary | ICD-10-CM | POA: Diagnosis not present

## 2016-08-31 DIAGNOSIS — F17210 Nicotine dependence, cigarettes, uncomplicated: Secondary | ICD-10-CM | POA: Insufficient documentation

## 2016-08-31 DIAGNOSIS — K0889 Other specified disorders of teeth and supporting structures: Secondary | ICD-10-CM | POA: Diagnosis present

## 2016-08-31 MED ORDER — PENICILLIN V POTASSIUM 250 MG PO TABS: 250.0000 mg | ORAL_TABLET | Freq: Four times a day (QID) | ORAL | 0 refills | Status: AC

## 2016-08-31 NOTE — ED Provider Notes (Signed)
[facility]-EMERGENCY DEPT Provider Note   CSN: [id number] Arrival date & time: 08/31/16  1210  By signing my name below, I, Bobbie Stackhristopher Reid, attest that this documentation has been prepared under the direction and in the presence of Teressa LowerVrinda Mei Suits, NP. Electronically Signed: Bobbie Stackhristopher Reid, Scribe. 08/31/16. 12:59 PM. History   Chief Complaint Chief Complaint  Patient presents with  . Dental Pain    The history is provided by the patient. No language interpreter was used.  HPI Comments: [name] is a 41 y.o. male who presents to the Emergency Department complaining of right upper dental pain that began 3 days ago. The patient reports some drainage from the area of his pain. The patient also reports having multiple broken teeth. He states that the last time he had to be place on antibiotics was last year. He reports no other symptoms. He denies difficulty swallowing or breathing. He states that he hasn't been to see a dentist in probably 10 years. He is allergic to sulfa antibiotics. He reports no alleviating factors.  Past Medical History:  Diagnosis Date  . Asthma in pediatric patient    Asthma as a child    There are no active problems to display for this patient.   History reviewed. No pertinent surgical history.     Home Medications    Prior to Admission medications   Medication Sig Start Date End Date Taking? Authorizing Provider  albuterol (PROVENTIL HFA;VENTOLIN HFA) 108 (90 Base) MCG/ACT inhaler Inhale 1-2 puffs into the lungs every 6 (six) hours as needed for wheezing or shortness of breath. 01/06/16   Courteney Lyn Mackuen, MD  cephALEXin (KEFLEX) 500 MG capsule Take 1 capsule (500 mg total) by mouth 3 (three) times daily. 05/18/16   Junius FinnerErin O'Malley, PA-C  HYDROcodone-acetaminophen (NORCO/VICODIN) 5-325 MG tablet Take 1-2 tablets by mouth every 6 (six) hours as needed for severe pain. 05/18/16   Junius FinnerErin O'Malley, PA-C  ibuprofen (ADVIL,MOTRIN) 200 MG tablet Take 400  mg by mouth every 6 (six) hours as needed for headache, mild pain or moderate pain.     Historical Provider, MD  penicillin v potassium (VEETID) 250 MG tablet Take 1 tablet (250 mg total) by mouth 4 (four) times daily. 08/31/16 09/07/16  Teressa LowerVrinda Adrean Heitz, NP  predniSONE (DELTASONE) 20 MG tablet Day 1 and 2: Take 3 tabs  Day 3-5: Take 2 tabs.  Day 5-8: take 1 tab 01/06/16   Courteney Lyn Mackuen, MD    Family History No family history on file.  Social History Social History  Substance Use Topics  . Smoking status: Current Every Day Smoker    Packs/day: 2.00    Types: Cigarettes  . Smokeless tobacco: Never Used  . Alcohol use 2.4 oz/week    4 Cans of beer per week     Allergies   Sulfa antibiotics   Review of Systems Review of Systems  Constitutional: Negative for fever.  HENT: Positive for dental problem. Negative for trouble swallowing.   Respiratory: Negative for shortness of breath.   All other systems reviewed and are negative.    Physical Exam Updated Vital Signs BP 131/84   Pulse 86   Temp 98.3 F (36.8 C) (Oral)   Resp 14   Ht 6\' 2"  (1.88 m)   Wt 211 lb 8 oz (95.9 kg)   SpO2 96%   BMI 27.15 kg/m   Physical Exam  Constitutional: He is oriented to person, place, and time. He appears well-developed and well-nourished.  HENT:  Head:  Normocephalic.  Right Ear: External ear normal.  Left Ear: External ear normal.  Multiple decayed teeth. No swelling noted  Eyes: EOM are normal.  Neck: Normal range of motion.  Pulmonary/Chest: Effort normal.  Abdominal: He exhibits no distension.  Musculoskeletal: Normal range of motion.  Neurological: He is alert and oriented to person, place, and time.  Psychiatric: He has a normal mood and affect.  Nursing note and vitals reviewed.    ED Treatments / Results  DIAGNOSTIC STUDIES: Oxygen Saturation is 96% on RA, adequate by my interpretation.    COORDINATION OF CARE: 12:47 PM Discussed treatment plan with pt at bedside  and pt agreed to plan. I have recommended different options for the patient.  Labs (all labs ordered are listed, but only abnormal results are displayed) Labs Reviewed - No data to display  EKG  EKG Interpretation None       Radiology No results found.  Procedures Procedures (including critical care time)  Medications Ordered in ED Medications - No data to display   Initial Impression / Assessment and Plan / ED Course  I have reviewed the triage vital signs and the nursing notes.  Pertinent labs & imaging results that were available during my care of the patient were reviewed by me and considered in my medical decision making (see chart for details).     No sign of ludwigs angina. Put on pcn and given dental referal  Final Clinical Impressions(s) / ED Diagnoses   Final diagnoses:  Dental infection    New Prescriptions New Prescriptions   PENICILLIN V POTASSIUM (VEETID) 250 MG TABLET    Take 1 tablet (250 mg total) by mouth 4 (four) times daily.   I personally performed the services described in this documentation, which was scribed in my presence. The recorded information has been reviewed and is accurate.    Teressa Lower, NP 08/31/16 1415    Raeford Razor, MD 09/07/16 1616

## 2016-08-31 NOTE — Discharge Planning (Signed)
[facility]EDCM entered ED Referral Summary with Disclaimer in [facility].

## 2016-08-31 NOTE — ED Triage Notes (Signed)
Pt c/o R upper dental tooth pain onset x 3 days with off white drainage from the area, pt reports multiple broken teeth, denies fever, A&O x4

## 2016-10-31 IMAGING — DX DG CHEST 2V
2 series · 2 of 2 positions shown · non-contrast
Comparison: 07/17/2013.

CLINICAL DATA: Chest pain, shortness of breath and cough for the
past 6 days.

EXAM:
CHEST  2 VIEW

[chest pa]
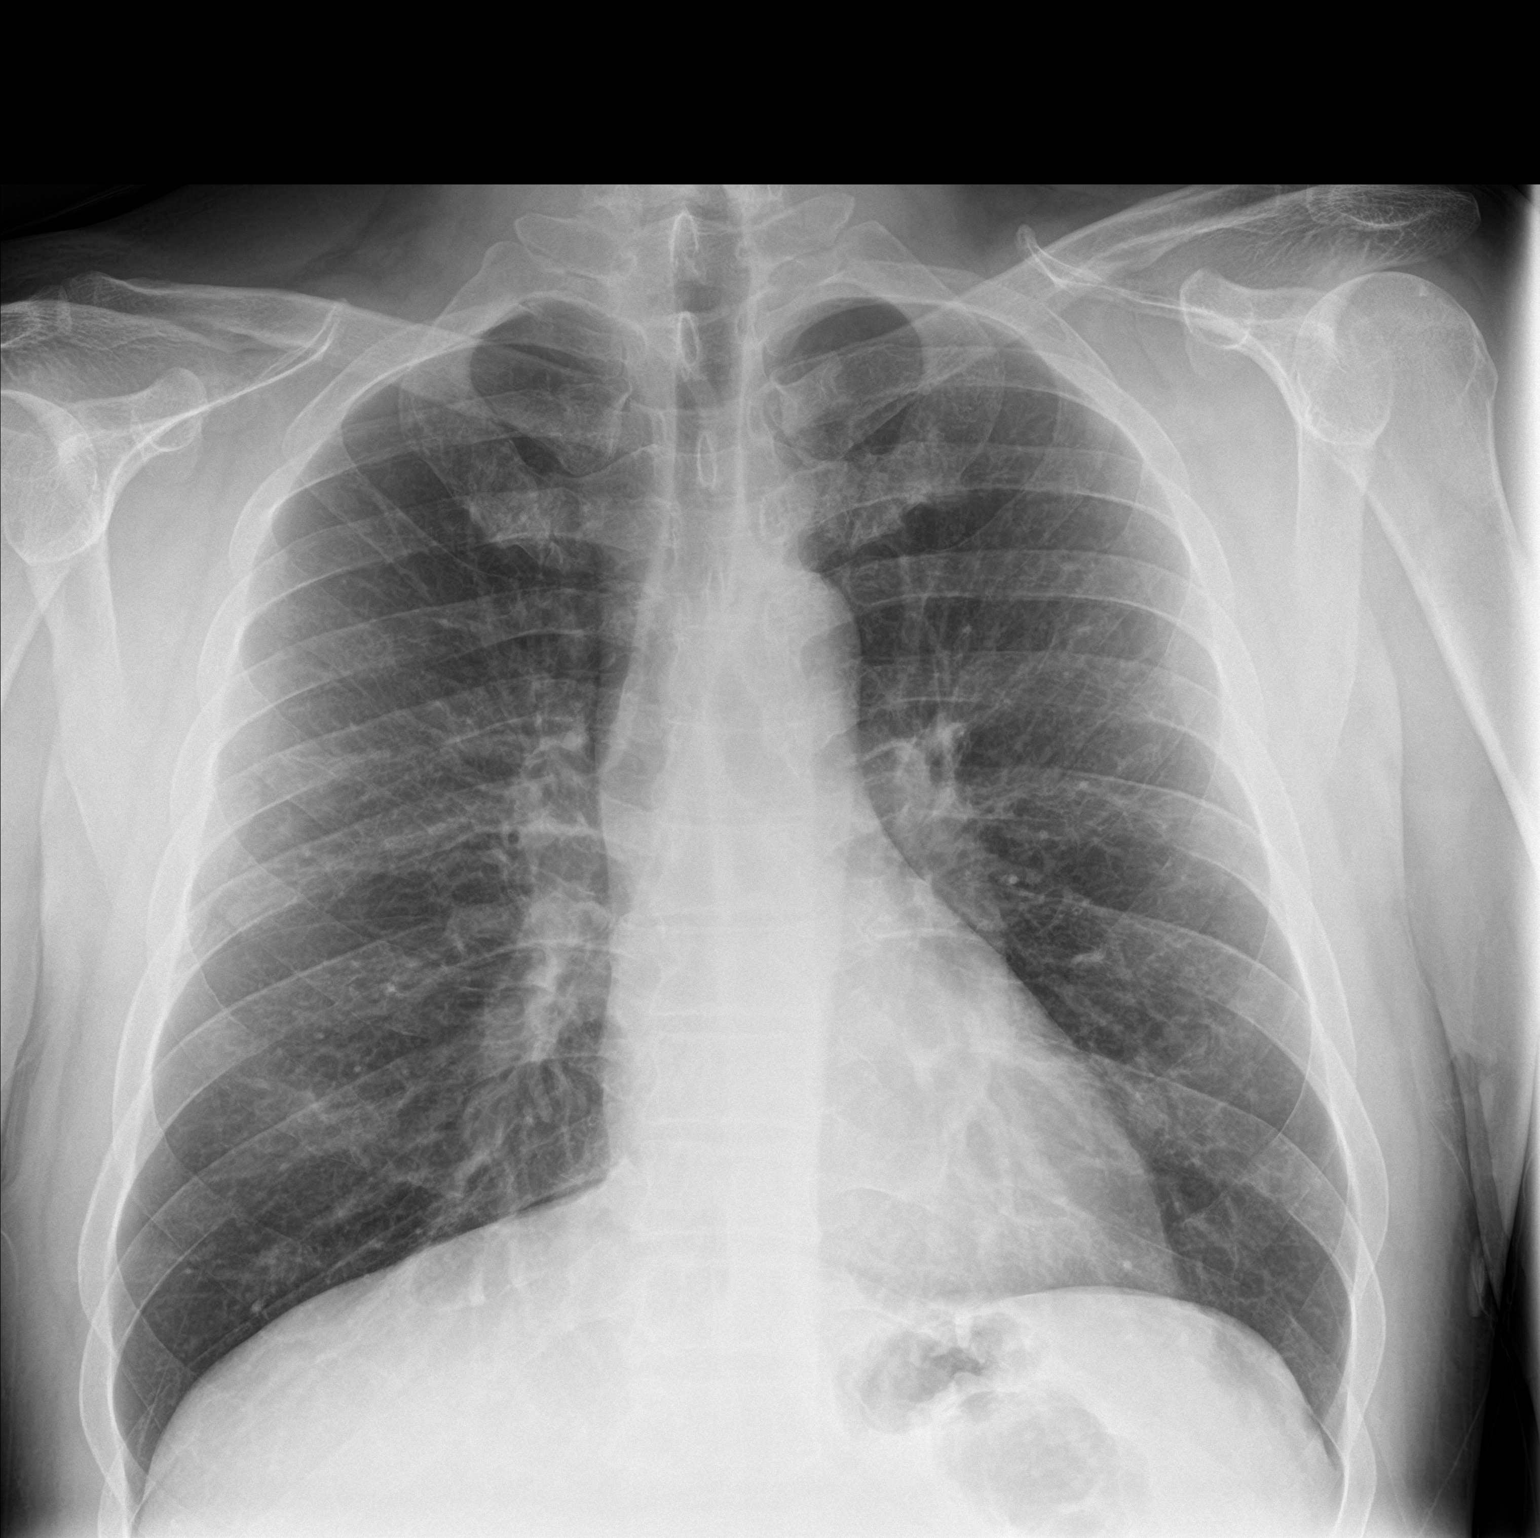

[chest lat]
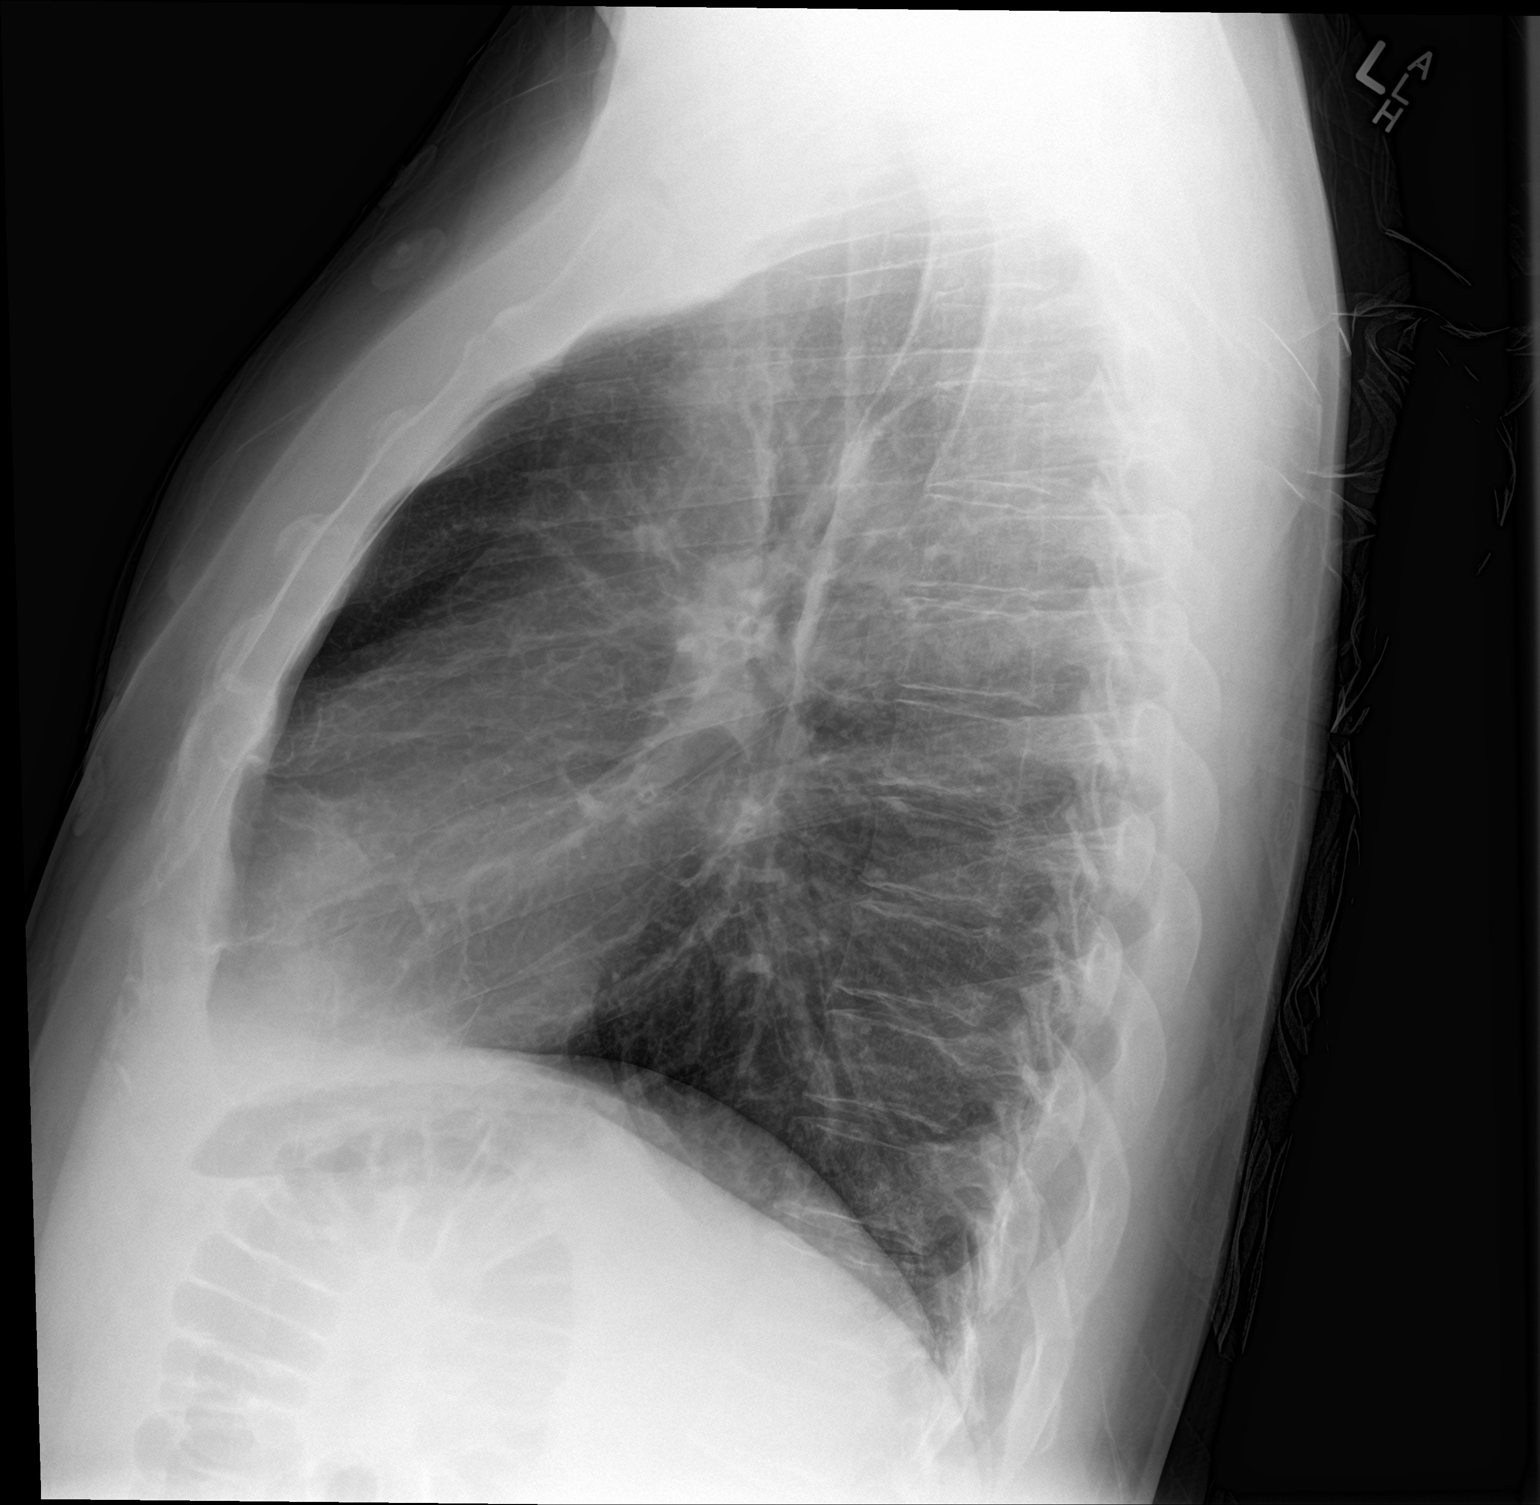

[2 of 2 positions shown; findings below may reference images not displayed]

FINDINGS: Normal sized heart. Clear lungs. Mild diffuse peribronchial
thickening. Unremarkable bones.
IMPRESSION: Stable mild chronic bronchitic changes.  No acute abnormality.

## 2018-09-28 DEATH — deceased
